# Patient Record
Sex: Female | Born: 1995 | Race: Black or African American | Hispanic: No | Marital: Single | Smoking: Never smoker
Health system: Southern US, Community
[De-identification: ages and names within clinical notes are randomized; demographics above are authoritative.]

## PROBLEM LIST (undated history)

## (undated) DIAGNOSIS — I10 Essential (primary) hypertension: Secondary | ICD-10-CM

---
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
## 2014-04-06 ENCOUNTER — Emergency Department (HOSPITAL_BASED_OUTPATIENT_CLINIC_OR_DEPARTMENT_OTHER)
Admission: EM | Admit: 2014-04-06 | Discharge: 2014-04-06 | Disposition: A | Discharge status: Home / Self Care | Payer: BC Managed Care – PPO | Attending: Emergency Medicine | Admitting: Emergency Medicine

## 2014-04-06 ENCOUNTER — Encounter (HOSPITAL_BASED_OUTPATIENT_CLINIC_OR_DEPARTMENT_OTHER): Payer: Self-pay | Admitting: Emergency Medicine

## 2014-04-06 DIAGNOSIS — Z79899 Other long term (current) drug therapy: Secondary | ICD-10-CM | POA: Insufficient documentation

## 2014-04-06 DIAGNOSIS — I10 Essential (primary) hypertension: Secondary | ICD-10-CM | POA: Insufficient documentation

## 2014-04-06 DIAGNOSIS — Z3202 Encounter for pregnancy test, result negative: Secondary | ICD-10-CM | POA: Insufficient documentation

## 2014-04-06 DIAGNOSIS — J029 Acute pharyngitis, unspecified: Secondary | ICD-10-CM | POA: Insufficient documentation

## 2014-04-06 DIAGNOSIS — Z792 Long term (current) use of antibiotics: Secondary | ICD-10-CM | POA: Diagnosis not present

## 2014-04-06 DIAGNOSIS — R300 Dysuria: Secondary | ICD-10-CM | POA: Diagnosis present

## 2014-04-06 DIAGNOSIS — R5381 Other malaise: Secondary | ICD-10-CM | POA: Diagnosis not present

## 2014-04-06 DIAGNOSIS — N3001 Acute cystitis with hematuria: Secondary | ICD-10-CM | POA: Insufficient documentation

## 2014-04-06 LAB — URINALYSIS, ROUTINE W REFLEX MICROSCOPIC
Glucose, UA: NEGATIVE mg/dL
KETONES UR: 15 mg/dL — AB
NITRITE: NEGATIVE
Protein, ur: 100 mg/dL — AB
SPECIFIC GRAVITY, URINE: 1.027 (ref 1.005–1.030)
UROBILINOGEN UA: 1 mg/dL (ref 0.0–1.0)
pH: 6 (ref 5.0–8.0)

## 2014-04-06 LAB — PREGNANCY, URINE: PREG TEST UR: NEGATIVE

## 2014-04-06 LAB — URINE MICROSCOPIC-ADD ON

## 2014-04-06 MED ORDER — PHENAZOPYRIDINE HCL 100 MG PO TABS
95.0000 mg | ORAL_TABLET | Freq: Once | ORAL | Status: AC
  Administered 2014-04-06: 100 mg via ORAL
  Filled 2014-04-06: qty 1

## 2014-04-06 MED ORDER — NITROFURANTOIN MONOHYD MACRO 100 MG PO CAPS
100.0000 mg | ORAL_CAPSULE | Freq: Once | ORAL | Status: AC
  Administered 2014-04-06: 100 mg via ORAL
  Filled 2014-04-06: qty 1

## 2014-04-06 MED ORDER — PHENAZOPYRIDINE HCL 200 MG PO TABS: 200.0000 mg | ORAL_TABLET | Freq: Three times a day (TID) | ORAL | Status: DC

## 2014-04-06 MED ORDER — NITROFURANTOIN MONOHYD MACRO 100 MG PO CAPS: 100.0000 mg | ORAL_CAPSULE | Freq: Two times a day (BID) | ORAL | Status: DC

## 2014-04-06 NOTE — ED Notes (Signed)
Pt reports hx bladder infection.  States that she thinks that she has one now.

## 2014-04-06 NOTE — ED Provider Notes (Signed)
CSN: [id number]     Arrival date & time 04/06/14  2140 History  This patient was seen in room MH01/MH01 and the patient's care was started at 10:47 PM.    Chief Complaint  Patient presents with  . Dysuria   The history is provided by the patient. No language interpreter was used.   HPI Comments: Gloria Reilly is a 18 y.o. female who presents to the Emergency Department complaining of a 2 day history of flulike symptoms which includes body aches, sore throat and general malaise. Today she developed dysuria. She is unable to describe what this dysuria feels like but she states that when she urinates she feels this discomfort throughout her entire body. She also has some urinary urgency. She has noted some hematuria. She cannot quantify how severe her symptoms are. She denies fever. She has had chills. She denies vaginal bleeding or discharge. She denies nausea or vomiting.  Past Medical History  Diagnosis Date  . Normal labor 02/14/2013  . Postpartum care following vaginal delivery (8/17) 02/14/2013  . Hypertension 02/28/2013   History reviewed. No pertinent past surgical history. Family History  Problem Relation Age of Onset  . Stroke Maternal Grandmother   . Diabetes Maternal Grandmother   . Diabetes Maternal Grandfather    History  Substance Use Topics  . Smoking status: Never Smoker   . Smokeless tobacco: Never Used  . Alcohol Use: No   OB History   Grav Para Term Preterm Abortions TAB SAB Ect Mult Living   1 1 1       1      Review of Systems A complete 10 system review of systems was obtained and all systems are negative except as noted in the HPI and PMH.    Allergies  Review of patient's allergies indicates no known allergies.  Home Medications   Prior to Admission medications   Medication Sig Start Date End Date Taking? Authorizing Provider  cephALEXin (KEFLEX) 500 MG capsule Take 1 capsule (500 mg total) by mouth 4 (four) times daily. 12/02/13   Shon Batonourtney F Horton, MD   hydrALAZINE (APRESOLINE) 10 MG tablet Take 1 tablet (10 mg total) by mouth 4 (four) times daily. 03/07/13   Sheronette Cathie BeamsA Cousins, MD  ibuprofen (ADVIL,MOTRIN) 100 MG/5ML suspension Take 30 mLs (600 mg total) by mouth every 6 (six) hours as needed for fever. 02/28/13   Lawernce PittsMelanie N Bhambri, CNM  ibuprofen (ADVIL,MOTRIN) 600 MG tablet Take 1 tablet (600 mg total) by mouth every 6 (six) hours as needed. 12/02/13   Shon Batonourtney F Horton, MD  labetalol (NORMODYNE) 300 MG tablet Take 1 tablet (300 mg total) by mouth 2 (two) times daily. 03/07/13   Sheronette Cathie BeamsA Cousins, MD  magnesium oxide (MAG-OX) 400 (241.3 MG) MG tablet Take 1 tablet (400 mg total) by mouth daily. 03/07/13   Sheronette Cathie BeamsA Cousins, MD  Prenatal Vit-Fe Fumarate-FA (PRENATAL MULTIVITAMIN) TABS tablet Take 1 tablet by mouth daily at 12 noon.    Historical Provider, MD   Triage vitals:BP 135/87  Pulse 109  Temp(Src) 99 F (37.2 C) (Oral)  Resp 18  Ht 5\' 6"  (1.676 m)  Wt 160 lb (72.576 kg)  BMI 25.84 kg/m2  SpO2 98%  LMP 03/23/2014  Breastfeeding? No  Physical Exam  Nursing note and vitals reviewed. General: Well-developed, well-nourished female in no acute distress; appearance consistent with age of record HENT: normocephalic; atraumatic; normal pharynx Eyes: pupils equal, round and reactive to light; extraocular muscles intact Neck: supple Heart: regular rate  and rhythm Lungs: clear to auscultation bilaterally Abdomen: soft; nondistended; nontender; no masses or hepatosplenomegaly; bowel sounds present Extremities: No deformity; full range of motion; pulses normal Neurologic: Awake, alert and oriented; motor function intact in all extremities and symmetric; no facial droop Skin: Warm and dry Psychiatric: Normal mood and affect   ED Course  Procedures (including critical care time)  MDM   Nursing notes and vitals signs, including pulse oximetry, reviewed.  Summary of this visit's results, reviewed by myself:  Labs:  Results for  orders placed during the hospital encounter of 04/06/14 (from the past 24 hour(s))  PREGNANCY, URINE     Status: None   Collection Time    04/06/14  9:53 PM      Result Value Ref Range   Preg Test, Ur NEGATIVE  NEGATIVE  URINALYSIS, ROUTINE W REFLEX MICROSCOPIC     Status: Abnormal   Collection Time    04/06/14  9:53 PM      Result Value Ref Range   Color, Urine AMBER (*) YELLOW   APPearance TURBID (*) CLEAR   Specific Gravity, Urine 1.027  1.005 - 1.030   pH 6.0  5.0 - 8.0   Glucose, UA NEGATIVE  NEGATIVE mg/dL   Hgb urine dipstick LARGE (*) NEGATIVE   Bilirubin Urine SMALL (*) NEGATIVE   Ketones, ur 15 (*) NEGATIVE mg/dL   Protein, ur 161 (*) NEGATIVE mg/dL   Urobilinogen, UA 1.0  0.0 - 1.0 mg/dL   Nitrite NEGATIVE  NEGATIVE   Leukocytes, UA LARGE (*) NEGATIVE  URINE MICROSCOPIC-ADD ON     Status: Abnormal   Collection Time    04/06/14  9:53 PM      Result Value Ref Range   Squamous Epithelial / LPF RARE  RARE   WBC, UA TOO NUMEROUS TO COUNT  <3 WBC/hpf   RBC / HPF 21-50  <3 RBC/hpf   Bacteria, UA FEW (*) RARE   Urine-Other MUCOUS PRESENT       Hanley Seamen, MD 04/06/14 [id number]

## 2014-04-06 NOTE — ED Notes (Signed)
C/o tingling w urination,  Blood in urine x 1 day

## 2014-04-06 NOTE — Discharge Instructions (Signed)

## 2014-04-06 NOTE — ED Notes (Signed)
MD at bedside. 

## 2014-04-09 LAB — URINE CULTURE

## 2014-04-11 ENCOUNTER — Telehealth (HOSPITAL_BASED_OUTPATIENT_CLINIC_OR_DEPARTMENT_OTHER): Payer: Self-pay

## 2014-04-11 NOTE — Telephone Encounter (Signed)
Post ED Visit - Positive Culture Follow-up  Culture report reviewed by antimicrobial stewardship pharmacist: []  Wes Dulaney, Pharm.D., BCPS []  Celedonio MiyamotoJeremy Frens, Pharm.D., BCPS []  Georgina PillionElizabeth Martin, Pharm.D., BCPS []  [place] Ph[place], [place] BCPS, AAHIVP [x]  Estella HuskMichelle Turner, Pharm.D., BCPS, AAHIVP []  Carly Sabat, Pharm.D. []  Enzo BiNathan Batchelder, Pharm.D.  Positive Urine culture Treated with Nitrofurantoin, organism sensitive to the same and no further patient follow-up is required at this time.  Arvid RightClark, Cace Osorto Dorn 04/11/2014, 5:17 AM

## 2014-05-02 ENCOUNTER — Encounter (HOSPITAL_BASED_OUTPATIENT_CLINIC_OR_DEPARTMENT_OTHER): Payer: Self-pay | Admitting: Emergency Medicine

## 2015-01-23 ENCOUNTER — Emergency Department (HOSPITAL_BASED_OUTPATIENT_CLINIC_OR_DEPARTMENT_OTHER): Payer: Medicaid Other

## 2015-01-23 ENCOUNTER — Emergency Department (HOSPITAL_BASED_OUTPATIENT_CLINIC_OR_DEPARTMENT_OTHER)
Admission: EM | Admit: 2015-01-23 | Discharge: 2015-01-23 | Disposition: A | Discharge status: Home / Self Care | Payer: Medicaid Other | Attending: Emergency Medicine | Admitting: Emergency Medicine

## 2015-01-23 ENCOUNTER — Encounter (HOSPITAL_BASED_OUTPATIENT_CLINIC_OR_DEPARTMENT_OTHER): Payer: Self-pay

## 2015-01-23 DIAGNOSIS — S161XXA Strain of muscle, fascia and tendon at neck level, initial encounter: Secondary | ICD-10-CM | POA: Diagnosis not present

## 2015-01-23 DIAGNOSIS — S39012A Strain of muscle, fascia and tendon of lower back, initial encounter: Secondary | ICD-10-CM | POA: Insufficient documentation

## 2015-01-23 DIAGNOSIS — I10 Essential (primary) hypertension: Secondary | ICD-10-CM | POA: Insufficient documentation

## 2015-01-23 DIAGNOSIS — Y9389 Activity, other specified: Secondary | ICD-10-CM | POA: Diagnosis not present

## 2015-01-23 DIAGNOSIS — Y92410 Unspecified street and highway as the place of occurrence of the external cause: Secondary | ICD-10-CM | POA: Diagnosis not present

## 2015-01-23 DIAGNOSIS — S199XXA Unspecified injury of neck, initial encounter: Secondary | ICD-10-CM | POA: Diagnosis present

## 2015-01-23 DIAGNOSIS — Y998 Other external cause status: Secondary | ICD-10-CM | POA: Diagnosis not present

## 2015-01-23 DIAGNOSIS — S4991XA Unspecified injury of right shoulder and upper arm, initial encounter: Secondary | ICD-10-CM | POA: Insufficient documentation

## 2015-01-23 DIAGNOSIS — S4992XA Unspecified injury of left shoulder and upper arm, initial encounter: Secondary | ICD-10-CM | POA: Diagnosis not present

## 2015-01-23 DIAGNOSIS — S299XXA Unspecified injury of thorax, initial encounter: Secondary | ICD-10-CM | POA: Diagnosis not present

## 2015-01-23 MED ORDER — NAPROXEN 500 MG PO TABS: 500.0000 mg | ORAL_TABLET | Freq: Two times a day (BID) | ORAL | Status: DC

## 2015-01-23 MED ORDER — METHOCARBAMOL 500 MG PO TABS: 500.0000 mg | ORAL_TABLET | Freq: Three times a day (TID) | ORAL | Status: DC | PRN
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
## 2015-01-23 NOTE — ED Notes (Signed)
MD at bedside. 

## 2015-01-23 NOTE — ED Notes (Signed)
All previous orders entered was entered in error

## 2015-09-14 ENCOUNTER — Encounter (HOSPITAL_BASED_OUTPATIENT_CLINIC_OR_DEPARTMENT_OTHER): Payer: Self-pay | Admitting: Emergency Medicine

## 2015-09-14 DIAGNOSIS — Z791 Long term (current) use of non-steroidal anti-inflammatories (NSAID): Secondary | ICD-10-CM | POA: Insufficient documentation

## 2015-09-14 DIAGNOSIS — Z3202 Encounter for pregnancy test, result negative: Secondary | ICD-10-CM | POA: Insufficient documentation

## 2015-09-14 DIAGNOSIS — I10 Essential (primary) hypertension: Secondary | ICD-10-CM | POA: Insufficient documentation

## 2015-09-14 NOTE — ED Notes (Signed)
Pt reports checking BP in [facility] tonight states it was 147/102 MAP 117. Pt reports chest discomfort when elevated. Not prescribed any medications.

## 2015-09-15 ENCOUNTER — Emergency Department (HOSPITAL_BASED_OUTPATIENT_CLINIC_OR_DEPARTMENT_OTHER)
Admission: EM | Admit: 2015-09-15 | Discharge: 2015-09-15 | Disposition: A | Discharge status: Home / Self Care | Payer: No Typology Code available for payment source | Attending: Emergency Medicine | Admitting: Emergency Medicine

## 2015-09-15 ENCOUNTER — Encounter (HOSPITAL_BASED_OUTPATIENT_CLINIC_OR_DEPARTMENT_OTHER): Payer: Self-pay | Admitting: Emergency Medicine

## 2015-09-15 DIAGNOSIS — I10 Essential (primary) hypertension: Secondary | ICD-10-CM

## 2015-09-15 LAB — URINE MICROSCOPIC-ADD ON

## 2015-09-15 LAB — URINALYSIS, ROUTINE W REFLEX MICROSCOPIC
BILIRUBIN URINE: NEGATIVE
GLUCOSE, UA: NEGATIVE mg/dL
HGB URINE DIPSTICK: NEGATIVE
Ketones, ur: NEGATIVE mg/dL
Nitrite: NEGATIVE
PH: 7 (ref 5.0–8.0)
Protein, ur: NEGATIVE mg/dL
SPECIFIC GRAVITY, URINE: 1.025 (ref 1.005–1.030)

## 2015-09-15 LAB — BASIC METABOLIC PANEL
ANION GAP: 9 (ref 5–15)
BUN: 10 mg/dL (ref 6–20)
CHLORIDE: 101 mmol/L (ref 101–111)
CO2: 26 mmol/L (ref 22–32)
Calcium: 9.1 mg/dL (ref 8.9–10.3)
Creatinine, Ser: 0.88 mg/dL (ref 0.44–1.00)
GFR calc Af Amer: >60 mL/min (ref >60)
GFR calc non Af Amer: >60 mL/min (ref >60)
GLUCOSE: 97 mg/dL (ref 65–99)
POTASSIUM: 3.7 mmol/L (ref 3.5–5.1)
Sodium: 136 mmol/L (ref 135–145)

## 2015-09-15 LAB — PREGNANCY, URINE: PREG TEST UR: NEGATIVE

## 2015-09-15 NOTE — ED Notes (Signed)
MD at bedside. 

## 2015-09-15 NOTE — Discharge Instructions (Signed)
DASH Eating Plan  DASH stands for "Dietary Approaches to Stop Hypertension." The DASH eating plan is a healthy eating plan that has been shown to reduce high blood pressure (hypertension). Additional health benefits may include reducing the risk of type 2 diabetes mellitus, heart disease, and stroke. The DASH eating plan may also help with weight loss.  WHAT DO I NEED TO KNOW ABOUT THE DASH EATING PLAN?  For the DASH eating plan, you will follow these general guidelines:  · Choose foods with a percent daily value for sodium of less than 5% (as listed on the food label).  · Use salt-free seasonings or herbs instead of table salt or sea salt.  · Check with your health care provider or pharmacist before using salt substitutes.  · Eat lower-sodium products, often labeled as "lower sodium" or "no salt added."  · Eat fresh foods.  · Eat more vegetables, fruits, and low-fat dairy products.  · Choose whole grains. Look for the word "whole" as the first word in the ingredient list.  · Choose fish and skinless chicken or turkey more often than red meat. Limit fish, poultry, and meat to 6 oz (170 g) each day.  · Limit sweets, desserts, sugars, and sugary drinks.  · Choose heart-healthy fats.  · Limit cheese to 1 oz (28 g) per day.  · Eat more home-cooked food and less restaurant, buffet, and fast food.  · Limit fried foods.  · Cook foods using methods other than frying.  · Limit canned vegetables. If you do use them, rinse them well to decrease the sodium.  · When eating at a restaurant, ask that your food be prepared with less salt, or no salt if possible.  WHAT FOODS CAN I EAT?  Seek help from a dietitian for individual calorie needs.  Grains  Whole grain or whole wheat bread. Brown rice. Whole grain or whole wheat pasta. Quinoa, bulgur, and whole grain cereals. Low-sodium cereals. Corn or whole wheat flour tortillas. Whole grain cornbread. Whole grain crackers. Low-sodium crackers.  Vegetables  Fresh or frozen vegetables  (raw, steamed, roasted, or grilled). Low-sodium or reduced-sodium tomato and vegetable juices. Low-sodium or reduced-sodium tomato sauce and paste. Low-sodium or reduced-sodium canned vegetables.   Fruits  All fresh, canned (in natural juice), or frozen fruits.  Meat and Other Protein Products  Ground beef (85% or leaner), grass-fed beef, or beef trimmed of fat. Skinless chicken or turkey. Ground chicken or turkey. Pork trimmed of fat. All fish and seafood. Eggs. Dried beans, peas, or lentils. Unsalted nuts and seeds. Unsalted canned beans.  Dairy  Low-fat dairy products, such as skim or 1% milk, 2% or reduced-fat cheeses, low-fat ricotta or cottage cheese, or plain low-fat yogurt. Low-sodium or reduced-sodium cheeses.  Fats and Oils  Tub margarines without trans fats. Light or reduced-fat mayonnaise and salad dressings (reduced sodium). Avocado. Safflower, olive, or canola oils. Natural peanut or almond butter.  Other  Unsalted popcorn and pretzels.  The items listed above may not be a complete list of recommended foods or beverages. Contact your dietitian for more options.  WHAT FOODS ARE NOT RECOMMENDED?  Grains  White bread. White pasta. White rice. Refined cornbread. Bagels and croissants. Crackers that contain trans fat.  Vegetables  Creamed or fried vegetables. Vegetables in a cheese sauce. Regular canned vegetables. Regular canned tomato sauce and paste. Regular tomato and vegetable juices.  Fruits  Dried fruits. Canned fruit in light or heavy syrup. Fruit juice.  Meat and Other Protein   Products  Fatty cuts of meat. Ribs, chicken wings, bacon, sausage, bologna, salami, chitterlings, fatback, hot dogs, bratwurst, and packaged luncheon meats. Salted nuts and seeds. Canned beans with salt.  Dairy  Whole or 2% milk, cream, half-and-half, and cream cheese. Whole-fat or sweetened yogurt. Full-fat cheeses or blue cheese. Nondairy creamers and whipped toppings. Processed cheese, cheese spreads, or cheese  curds.  Condiments  Onion and garlic salt, seasoned salt, table salt, and sea salt. Canned and packaged gravies. Worcestershire sauce. Tartar sauce. Barbecue sauce. Teriyaki sauce. Soy sauce, including reduced sodium. Steak sauce. Fish sauce. Oyster sauce. Cocktail sauce. Horseradish. Ketchup and mustard. Meat flavorings and tenderizers. Bouillon cubes. Hot sauce. Tabasco sauce. Marinades. Taco seasonings. Relishes.  Fats and Oils  Butter, stick margarine, lard, shortening, ghee, and bacon fat. Coconut, palm kernel, or palm oils. Regular salad dressings.  Other  Pickles and olives. Salted popcorn and pretzels.  The items listed above may not be a complete list of foods and beverages to avoid. Contact your dietitian for more information.  WHERE CAN I FIND MORE INFORMATION?  National Heart, Lung, and [employer] Institute: www.nhlbi.nih.gov/health/health-topics/topics/dash/     This information is not intended to replace advice given to you by your health care provider. Make sure you discuss any questions you have with your health care provider.     Document Released: 06/06/2011 Document Revised: 07/08/2014 Document Reviewed: 04/21/2013  Elsevier Interactive Patient Education ©2016 [employer]

## 2015-09-15 NOTE — ED Notes (Signed)
Pt placed on auto vitals Q30. Patient placed on cardiac monitor.  

## 2015-09-15 NOTE — ED Provider Notes (Signed)
CSN: [id number]     Arrival date & time 09/14/15  2155 History   First MD Initiated Contact with Patient 09/15/15 0121     Chief Complaint  Patient presents with  . Hypertension     (Consider location/radiation/quality/duration/timing/severity/associated sxs/prior Treatment) Patient is a 20 y.o. female presenting with hypertension. The history is provided by the patient.  Hypertension This is a recurrent problem. The current episode started yesterday. The problem occurs constantly. The problem has not changed since onset.Pertinent negatives include no chest pain, no abdominal pain, no headaches and no shortness of breath. Nothing aggravates the symptoms. Nothing relieves the symptoms. She has tried nothing for the symptoms. The treatment provided no relief.  HAd HTN during and after pregnancy and meds tapered off now it is back.    Past Medical History  Diagnosis Date  . Normal labor 02/14/2013  . Postpartum care following vaginal delivery (8/17) 02/14/2013  . Hypertension 02/28/2013   History reviewed. No pertinent past surgical history. Family History  Problem Relation Age of Onset  . Stroke Maternal Grandmother   . Diabetes Maternal Grandmother   . Diabetes Maternal Grandfather    Social History  Substance Use Topics  . Smoking status: Never Smoker   . Smokeless tobacco: Never Used  . Alcohol Use: Yes   OB History    Gravida Para Term Preterm AB TAB SAB Ectopic Multiple Living   Review of Systems  Respiratory: Negative for shortness of breath.   Cardiovascular: Negative for chest pain.  Gastrointestinal: Negative for abdominal pain.  Neurological: Negative for headaches.  All other systems reviewed and are negative.     Allergies  Review of patient's allergies indicates no known allergies.  Home Medications   Prior to Admission medications   Medication Sig Start Date End Date Taking? Authorizing Provider  methocarbamol (ROBAXIN) 500 MG tablet  Take 1 tablet (500 mg total) by mouth every 8 (eight) hours as needed for muscle spasms. 01/23/15   Robyn M Hess, PA-C  naproxen (NAPROSYN) 500 MG tablet Take 1 tablet (500 mg total) by mouth 2 (two) times daily. 01/23/15   Robyn M Hess, PA-C   BP 150/105 mmHg  Pulse 74  Temp(Src) 98.4 F (36.9 C) (Oral)  Resp 18  Ht  (1.702 m)  Wt 162 lb 7 oz (73.681 kg)  BMI 25.44 kg/m2  SpO2 100%  LMP 09/12/2015 Physical Exam  Constitutional: She is oriented to person, place, and time. She appears well-developed and well-nourished. No distress.  HENT:  Head: Normocephalic and atraumatic.  Mouth/Throat: Oropharynx is clear and moist.  Eyes: Conjunctivae are normal. Pupils are equal, round, and reactive to light.  Neck: Normal range of motion. Neck supple.  Cardiovascular: Normal rate, regular rhythm and intact distal pulses.   Pulmonary/Chest: Effort normal and breath sounds normal. No respiratory distress. She has no wheezes. She has no rales.  Abdominal: Soft. Bowel sounds are normal. There is no tenderness. There is no rebound and no guarding.  Musculoskeletal: Normal range of motion.  Neurological: She is alert and oriented to person, place, and time.  Skin: Skin is warm and dry.  Psychiatric: She has a normal mood and affect.    ED Course  Procedures (including critical care time) Labs Review Labs Reviewed  URINALYSIS, ROUTINE W REFLEX MICROSCOPIC (NOT AT [facility]) - Abnormal; Notable for the following:    APPearance CLOUDY (*)    Leukocytes, UA  SMALL (*)    All other components within normal limits  URINE MICROSCOPIC-ADD ON - Abnormal; Notable for the following:    Squamous Epithelial / LPF 0-5 (*)    Bacteria, UA FEW (*)    All other components within normal limits  PREGNANCY, URINE  BASIC METABOLIC PANEL    Imaging Review No results found. I have personally reviewed and evaluated these images and lab results as part of my medical decision-making.   EKG  Interpretation   Date/Time:  Friday September 15 2015 01:06:34 EDT Ventricular Rate:  81 PR Interval:  166 QRS Duration: 88 QT Interval:  364 QTC Calculation: 422 R Axis:   81 Text Interpretation:  Sinus rhythm Confirmed by [facility]  MD, Sherrod Toothman  ([id number]) on 09/15/2015 1:13:14 AM      MDM   Final diagnoses:  None   BP improved without intervention in the ED   Low sodium diet and follow up with PMD for ongoing care.  Patient verbalizes understanding and agrees to follow up    Amoria Mclees, MD 09/15/15 [id number]

## 2015-10-07 DIAGNOSIS — Z79899 Other long term (current) drug therapy: Secondary | ICD-10-CM | POA: Insufficient documentation

## 2015-10-07 DIAGNOSIS — I10 Essential (primary) hypertension: Secondary | ICD-10-CM | POA: Insufficient documentation

## 2015-10-07 DIAGNOSIS — J111 Influenza due to unidentified influenza virus with other respiratory manifestations: Secondary | ICD-10-CM | POA: Insufficient documentation

## 2015-10-08 ENCOUNTER — Encounter (HOSPITAL_BASED_OUTPATIENT_CLINIC_OR_DEPARTMENT_OTHER): Payer: Self-pay

## 2015-10-08 ENCOUNTER — Emergency Department (HOSPITAL_BASED_OUTPATIENT_CLINIC_OR_DEPARTMENT_OTHER)
Admission: EM | Admit: 2015-10-08 | Discharge: 2015-10-08 | Disposition: A | Discharge status: Home / Self Care | Payer: No Typology Code available for payment source | Attending: Emergency Medicine | Admitting: Emergency Medicine

## 2015-10-08 DIAGNOSIS — J111 Influenza due to unidentified influenza virus with other respiratory manifestations: Secondary | ICD-10-CM

## 2015-10-08 DIAGNOSIS — R69 Illness, unspecified: Secondary | ICD-10-CM

## 2015-10-08 LAB — RAPID STREP SCREEN (MED CTR MEBANE ONLY): Streptococcus, Group A Screen (Direct): NEGATIVE

## 2015-10-08 MED ORDER — NAPROXEN 500 MG PO TABS: ORAL_TABLET | ORAL | Status: DC

## 2015-10-08 MED ORDER — IBUPROFEN 400 MG PO TABS
600.0000 mg | ORAL_TABLET | Freq: Once | ORAL | Status: AC
  Administered 2015-10-08: 600 mg via ORAL
  Filled 2015-10-08: qty 1

## 2015-10-08 MED ORDER — NAPROXEN 250 MG PO TABS
500.0000 mg | ORAL_TABLET | Freq: Once | ORAL | Status: DC
Start: 2015-10-08 — End: 2015-10-08

## 2015-10-08 MED ORDER — OXYCODONE-ACETAMINOPHEN 5-325 MG PO TABS
1.0000 | ORAL_TABLET | Freq: Once | ORAL | Status: AC
  Administered 2015-10-08: 1 via ORAL
  Filled 2015-10-08: qty 1

## 2015-10-08 MED ORDER — ONDANSETRON 8 MG PO TBDP
8.0000 mg | ORAL_TABLET | Freq: Once | ORAL | Status: AC
  Administered 2015-10-08: 8 mg via ORAL
  Filled 2015-10-08: qty 1

## 2015-10-08 NOTE — ED Notes (Addendum)
Pt reports 3 day history of sore throat, stuffy nose, body aches, ear pain, "think i have the flu."

## 2015-10-08 NOTE — ED Provider Notes (Signed)
CSN: [id number]     Arrival date & time 10/07/15  2339 History   First MD Initiated Contact with Patient 10/08/15 0402     Chief Complaint  Patient presents with  . Sore Throat     (Consider location/radiation/quality/duration/timing/severity/associated sxs/prior Treatment) HPI  This is a 20 year old female with a two-day history of sore throat, worse with swallowing. She has also had body aches, ear pain and nonproductive cough. She denies shortness of breath, nasal congestion, nausea, vomiting or diarrhea. Symptoms are moderate. She has not taken anything for it.  Past Medical History  Diagnosis Date  . Normal labor 02/14/2013  . Postpartum care following vaginal delivery (8/17) 02/14/2013  . Hypertension 02/28/2013   History reviewed. No pertinent past surgical history. Family History  Problem Relation Age of Onset  . Stroke Maternal Grandmother   . Diabetes Maternal Grandmother   . Diabetes Maternal Grandfather    Social History  Substance Use Topics  . Smoking status: Never Smoker   . Smokeless tobacco: Never Used  . Alcohol Use: Yes     Comment: occasional    OB History    Gravida Para Term Preterm AB TAB SAB Ectopic Multiple Living   1 1 1       1      Review of Systems  All other systems reviewed and are negative.   Allergies  Review of patient's allergies indicates no known allergies.  Home Medications   Prior to Admission medications   Medication Sig Start Date End Date Taking? Authorizing Provider  Etonogestrel (IMPLANON [place]) Inject into the skin.   Yes Historical Provider, MD  methocarbamol (ROBAXIN) 500 MG tablet Take 1 tablet (500 mg total) by mouth every 8 (eight) hours as needed for muscle spasms. 01/23/15   Robyn M Hess, PA-C  naproxen (NAPROSYN) 500 MG tablet Take 1 tablet (500 mg total) by mouth 2 (two) times daily. 01/23/15   Robyn M Hess, PA-C   BP 136/96 mmHg  Pulse 109  Temp(Src) 99.1 F (37.3 C) (Oral)  Resp 16  Ht 5\' 6"  (1.676 m)  Wt 162 lb  (73.483 kg)  BMI 26.16 kg/m2  SpO2 99%  LMP 09/02/2015 (LMP Unknown)   Physical Exam  General: Well-developed, well-nourished female in no acute distress; appearance consistent with age of record HENT: normocephalic; atraumatic; TMs normal; pharynx erythematous without edema or exudate Eyes: pupils equal, round and reactive to light; extraocular muscles intact Neck: supple Heart: regular rate and rhythm Lungs: clear to auscultation bilaterally Abdomen: soft; nondistended; nontender; no masses or hepatosplenomegaly; bowel sounds present Extremities: No deformity; full range of motion; pulses normal Neurologic: Sleeping but readily awakened; motor function intact in all extremities and symmetric; no facial droop Skin: Warm and dry Psychiatric: Normal mood and affect    ED Course  Procedures (including critical care time)   MDM     Paula LibraJohn Chapman Matteucci, MD 10/08/15 [id number]

## 2015-10-09 LAB — CULTURE, GROUP A STREP (THRC)

## 2015-10-16 ENCOUNTER — Emergency Department (HOSPITAL_BASED_OUTPATIENT_CLINIC_OR_DEPARTMENT_OTHER)
Admission: EM | Admit: 2015-10-16 | Discharge: 2015-10-16 | Disposition: A | Discharge status: Home / Self Care | Payer: No Typology Code available for payment source | Attending: Emergency Medicine | Admitting: Emergency Medicine

## 2015-10-16 ENCOUNTER — Encounter (HOSPITAL_BASED_OUTPATIENT_CLINIC_OR_DEPARTMENT_OTHER): Payer: Self-pay | Admitting: Emergency Medicine

## 2015-10-16 DIAGNOSIS — R053 Chronic cough: Secondary | ICD-10-CM

## 2015-10-16 DIAGNOSIS — R05 Cough: Secondary | ICD-10-CM | POA: Insufficient documentation

## 2015-10-16 DIAGNOSIS — Z113 Encounter for screening for infections with a predominantly sexual mode of transmission: Secondary | ICD-10-CM | POA: Insufficient documentation

## 2015-10-16 DIAGNOSIS — I10 Essential (primary) hypertension: Secondary | ICD-10-CM | POA: Insufficient documentation

## 2015-10-16 MED ORDER — AZITHROMYCIN 250 MG PO TABS: ORAL_TABLET | ORAL | Status: DC

## 2015-10-16 MED ORDER — ALBUTEROL SULFATE HFA 108 (90 BASE) MCG/ACT IN AERS
2.0000 | INHALATION_SPRAY | RESPIRATORY_TRACT | Status: DC | PRN
  Administered 2015-10-16: 2 via RESPIRATORY_TRACT
  Filled 2015-10-16: qty 6.7

## 2015-10-16 NOTE — ED Notes (Signed)
Pt in c/o cough and sore throat onset approx 2 weeks. Has been seen and treated for same but feels no better. Also states has been sexually active and is requesting to be screened for STDs.

## 2015-10-16 NOTE — ED Provider Notes (Signed)
CSN: [id number]     Arrival date & time 10/16/15  0055 History   First MD Initiated Contact with Patient 10/16/15 0110     Chief Complaint  Patient presents with  . Cough  . Sore Throat     (Consider location/radiation/quality/duration/timing/severity/associated sxs/prior Treatment) HPI  This is a 20 year old female who was seen by myself on the ninth of this month for flulike symptoms. She returns complaining of persistent cough and sore throat. She describes the cough as dry and feels like she is unable to cough up mucus. Her sore throat is moderate and worse with swallowing. She denies fever, body aches, nausea, vomiting or diarrhea. She has no vaginal bleeding or discharge but has been sexually active and is requesting to be tested for STDs.  Past Medical History  Diagnosis Date  . Normal labor 02/14/2013  . Postpartum care following vaginal delivery (8/17) 02/14/2013  . Hypertension 02/28/2013   History reviewed. No pertinent past surgical history. Family History  Problem Relation Age of Onset  . Stroke Maternal Grandmother   . Diabetes Maternal Grandmother   . Diabetes Maternal Grandfather    Social History  Substance Use Topics  . Smoking status: Never Smoker   . Smokeless tobacco: Never Used  . Alcohol Use: Yes     Comment: occasional    OB History    Gravida Para Term Preterm AB TAB SAB Ectopic Multiple Living   1 1 1       1      Review of Systems  All other systems reviewed and are negative.   Allergies  Review of patient's allergies indicates no known allergies.  Home Medications   Prior to Admission medications   Medication Sig Start Date End Date Taking? Authorizing Provider  Etonogestrel (IMPLANON [place]) Inject into the skin.    Historical Provider, MD  naproxen (NAPROSYN) 500 MG tablet Take 1 tablet twice daily as needed for fever or pain. 10/08/15   Evee Liska, MD   BP 134/87 mmHg  Pulse 79  Temp(Src) 98.8 F (37.1 C) (Oral)  Resp 18  Ht 5\' 6"  (1.676  m)  Wt 152 lb (68.947 kg)  BMI 24.55 kg/m2  SpO2 99%  LMP 09/02/2015 (LMP Unknown)   Physical Exam  General: Well-developed, well-nourished female in no acute distress; appearance consistent with age of record HENT: normocephalic; atraumatic; no pharyngeal erythema or exudate Eyes: pupils equal, round and reactive to light; extraocular muscles intact Neck: supple Heart: regular rate and rhythm Lungs: clear to auscultation bilaterally; coughing Abdomen: soft; nondistended; nontender; no masses or hepatosplenomegaly; bowel sounds present Extremities: No deformity; full range of motion; pulses normal Neurologic: Awake, alert and oriented; motor function intact in all extremities and symmetric; no facial droop Skin: Warm and dry Psychiatric: Normal mood and affect    ED Course  Procedures (including critical care time)   MDM  The patient's strep culture did not grow out group A strep but did grow out a beta-hemolytic strep strain. We will treat as a precaution. We will also provide an inhaler for her persistent cough.   Paula LibraJohn Devaunte Gasparini, MD 10/16/15 [phone]

## 2015-10-17 LAB — GC/CHLAMYDIA PROBE AMP (~~LOC~~) NOT AT ARMC
CHLAMYDIA, DNA PROBE: POSITIVE — AB
NEISSERIA GONORRHEA: NEGATIVE

## 2015-10-18 ENCOUNTER — Telehealth: Payer: Self-pay | Admitting: *Deleted

## 2015-10-18 LAB — HIV ANTIBODY (ROUTINE TESTING W REFLEX): HIV SCREEN 4TH GENERATION: NONREACTIVE

## 2015-10-18 NOTE — ED Notes (Signed)
Spoke with patient, verified ID, informed of labs, treated per protocol, DHHS form faxed, patient informed to abstain for sexual activity x 10 days and notify sexual partners for evaluation and treatment 

## 2015-10-19 LAB — RPR: RPR Ser Ql: NONREACTIVE

## 2015-12-30 ENCOUNTER — Emergency Department (HOSPITAL_BASED_OUTPATIENT_CLINIC_OR_DEPARTMENT_OTHER)
Admission: EM | Admit: 2015-12-30 | Discharge: 2015-12-30 | Disposition: A | Discharge status: Home / Self Care | Payer: No Typology Code available for payment source | Attending: Emergency Medicine | Admitting: Emergency Medicine

## 2015-12-30 ENCOUNTER — Encounter (HOSPITAL_BASED_OUTPATIENT_CLINIC_OR_DEPARTMENT_OTHER): Payer: Self-pay | Admitting: *Deleted

## 2015-12-30 DIAGNOSIS — I10 Essential (primary) hypertension: Secondary | ICD-10-CM | POA: Insufficient documentation

## 2015-12-30 DIAGNOSIS — N938 Other specified abnormal uterine and vaginal bleeding: Secondary | ICD-10-CM | POA: Insufficient documentation

## 2015-12-30 LAB — URINALYSIS, ROUTINE W REFLEX MICROSCOPIC
BILIRUBIN URINE: NEGATIVE
Glucose, UA: NEGATIVE mg/dL
Ketones, ur: 15 mg/dL — AB
Leukocytes, UA: NEGATIVE
NITRITE: NEGATIVE
PH: 6 (ref 5.0–8.0)
Protein, ur: NEGATIVE mg/dL
SPECIFIC GRAVITY, URINE: 1.037 — AB (ref 1.005–1.030)

## 2015-12-30 LAB — URINE MICROSCOPIC-ADD ON

## 2015-12-30 LAB — PREGNANCY, URINE: Preg Test, Ur: NEGATIVE

## 2015-12-30 LAB — WET PREP, GENITAL
SPERM: NONE SEEN
Trich, Wet Prep: NONE SEEN
YEAST WET PREP: NONE SEEN

## 2015-12-30 MED ORDER — CEFTRIAXONE SODIUM 250 MG IJ SOLR
250.0000 mg | Freq: Once | INTRAMUSCULAR | Status: AC
  Administered 2015-12-30: 250 mg via INTRAMUSCULAR
  Filled 2015-12-30: qty 250

## 2015-12-30 MED ORDER — AZITHROMYCIN 250 MG PO TABS
1000.0000 mg | ORAL_TABLET | Freq: Once | ORAL | Status: AC
  Administered 2015-12-30: 1000 mg via ORAL
  Filled 2015-12-30: qty 4

## 2015-12-30 NOTE — ED Provider Notes (Signed)
CSN: [id number]     Arrival date & time 12/30/15  1319 History   First MD Initiated Contact with Patient 12/30/15 1344     Chief Complaint  Patient presents with  . Abdominal Pain     (Consider location/radiation/quality/duration/timing/severity/associated sxs/prior Treatment) Patient is a 20 y.o. female presenting with abdominal pain. The history is provided by the patient.  Abdominal Pain Pain location:  Suprapubic Pain quality: cramping   Pain radiates to:  Does not radiate Pain severity:  Moderate Onset quality:  Gradual Duration:  2 days Timing:  Intermittent Progression:  Waxing and waning Chronicity:  New Context comment:  Patient currently has implant birth control but this month has had 3 periods. The birth control is supposed to be changed in September. She started bleeding yesterday and states it's mixed with blood and discharge. Relieved by:  None tried Worsened by:  Nothing tried Ineffective treatments:  None tried Associated symptoms: vaginal bleeding and vaginal discharge   Associated symptoms: no anorexia, no chills, no diarrhea, no dysuria, no fever, no nausea and no vomiting   Risk factors comment:  Patient was treated for chlamydia approximately 2-3 months ago and is currently sexually active with a new partner and does not use protection.   Past Medical History  Diagnosis Date  . Normal labor 02/14/2013  . Postpartum care following vaginal delivery (8/17) 02/14/2013  . Hypertension 02/28/2013   History reviewed. No pertinent past surgical history. Family History  Problem Relation Age of Onset  . Stroke Maternal Grandmother   . Diabetes Maternal Grandmother   . Diabetes Maternal Grandfather    Social History  Substance Use Topics  . Smoking status: Never Smoker   . Smokeless tobacco: Never Used  . Alcohol Use: Yes     Comment: occasional    OB History    Gravida Para Term Preterm AB TAB SAB Ectopic Multiple Living   1 1 1       1      Review of  Systems  Constitutional: Negative for fever and chills.  Gastrointestinal: Positive for abdominal pain. Negative for nausea, vomiting, diarrhea and anorexia.  Genitourinary: Positive for vaginal bleeding and vaginal discharge. Negative for dysuria.  All other systems reviewed and are negative.     Allergies  Review of patient's allergies indicates no known allergies.  Home Medications   Prior to Admission medications   Medication Sig Start Date End Date Taking? Authorizing Provider  azithromycin (ZITHROMAX Z-PAK) 250 MG tablet 2 po day one, then 1 daily x 4 days 10/16/15   Paula LibraJohn Molpus, MD  Etonogestrel [facility](IMPLANON [place]) Inject into the skin.    Historical Provider, MD  naproxen (NAPROSYN) 500 MG tablet Take 1 tablet twice daily as needed for fever or pain. 10/08/15   John Molpus, MD   BP 137/94 mmHg  Pulse 82  Temp(Src) 99.2 F (37.3 C) (Oral)  Resp 18  Ht 5\' 7"  (1.702 m)  Wt 165 lb (74.844 kg)  BMI 25.84 kg/m2  SpO2 100% Physical Exam  Constitutional: She is oriented to person, place, and time. She appears well-developed and well-nourished. No distress.  HENT:  Head: Normocephalic and atraumatic.  Mouth/Throat: Oropharynx is clear and moist.  Eyes: Conjunctivae and EOM are normal. Pupils are equal, round, and reactive to light.  Neck: Normal range of motion. Neck supple.  Cardiovascular: Normal rate, regular rhythm and intact distal pulses.   No murmur heard. Pulmonary/Chest: Effort normal and breath sounds normal. No respiratory distress. She has no wheezes.  She has no rales.  Abdominal: Soft. She exhibits no distension. There is tenderness. There is no rebound and no guarding.  Genitourinary: Vagina normal and uterus normal. Cervix exhibits no motion tenderness, no discharge and no friability. Right adnexum displays no mass and no tenderness. Left adnexum displays no mass and no tenderness. No vaginal discharge found.  Musculoskeletal: Normal range of motion. She exhibits no  edema or tenderness.  Neurological: She is alert and oriented to person, place, and time.  Skin: Skin is warm and dry. No rash noted. No erythema.  Psychiatric: She has a normal mood and affect. Her behavior is normal.  Nursing note and vitals reviewed.   ED Course  Procedures (including critical care time) Labs Review Labs Reviewed  WET PREP, GENITAL - Abnormal; Notable for the following:    Clue Cells Wet Prep HPF POC PRESENT (*)    WBC, Wet Prep HPF POC MANY (*)    All other components within normal limits  URINALYSIS, ROUTINE W REFLEX MICROSCOPIC (NOT AT [facility]) - Abnormal; Notable for the following:    Specific Gravity, Urine 1.037 (*)    Hgb urine dipstick TRACE (*)    Ketones, ur 15 (*)    All other components within normal limits  URINE MICROSCOPIC-ADD ON - Abnormal; Notable for the following:    Squamous Epithelial / LPF 0-5 (*)    Bacteria, UA MANY (*)    All other components within normal limits  PREGNANCY, URINE  HIV ANTIBODY (ROUTINE TESTING)  RPR  GC/CHLAMYDIA PROBE AMP ([place]) NOT AT [facility]    Imaging Review No results found. I have personally reviewed and evaluated these images and lab results as part of my medical decision-making.   EKG Interpretation None      MDM   Final diagnoses:  DUB (dysfunctional uterine bleeding)    Patient is a 20 year old female with lower abdominal pain that is most consistent with menstrual cramping and dysfunctional uterine bleeding which is most likely a result of her birth control getting ready to be replaced. She has had some bleeding for 2 days and some minimal discharge. However she months ago she was treated for chlamydia but states she lost one of the pills and did not get the full antibiotic. She is with a new sexual partner and as far she knows he is having no symptoms. She denies any urinary complaints and her urine pregnancy test is negative. On exam she has no concerning abdominal pain that's reproducible. No  upper abdominal pain or concerns for cholecystitis, appendicitis, pancreatitis or diverticulitis. No flank pain and no urinary symptoms concerning for UTI or pyelonephritis. Urine is within normal limits. Pelvic exam pending. Patient is opting for treatment for STDs prophylactically. She will be given Rocephin and azithromycin. GC chlamydia, HIV and syphilis pending. Pelvic exam without notable findings. Secondly patient is getting intermittent right rashes on her hands. There is minimal papular lesions on bilateral dorsal hands today without concerning findings. Most likely allergic versus mild eczema firm excessive handwashing. Recommended using thick lotions and hydrocortisone cream when necessary    Gwyneth SproutWhitney Jamarien Rodkey, MD 12/30/15 1513

## 2015-12-30 NOTE — ED Notes (Signed)
MD at bedside. 

## 2015-12-30 NOTE — Discharge Instructions (Signed)
Try Ibuprofen or Aleve for abdominal cramps Dysfunctional Uterine Bleeding Dysfunctional uterine bleeding is abnormal bleeding from the uterus. Dysfunctional uterine bleeding includes:  A period that comes earlier or later than usual.  A period that is lighter, heavier, or has blood clots.  Bleeding between periods.  Skipping one or more periods.  Bleeding after sexual intercourse.  Bleeding after menopause. HOME CARE INSTRUCTIONS  Pay attention to any changes in your symptoms. Follow these instructions to help with your condition: Eating  Eat well-balanced meals. Include foods that are high in iron, such as liver, meat, shellfish, green leafy vegetables, and eggs.  If you become constipated:  Drink plenty of water.  Eat fruits and vegetables that are high in water and fiber, such as spinach, carrots, raspberries, apples, and mango. Medicines  Take over-the-counter and prescription medicines only as told by your health care provider.  Do not change medicines without talking with your health care provider.  Aspirin or medicines that contain aspirin may make the bleeding worse. Do not take those medicines:  During the week before your period.  During your period.  If you were prescribed iron pills, take them as told by your health care provider. Iron pills help to replace iron that your body loses because of this condition. Activity  If you need to change your sanitary pad or tampon more than one time every 2 hours:  Lie in bed with your feet raised (elevated).  Place a cold pack on your lower abdomen.  Rest as much as possible until the bleeding stops or slows down.  Do not try to lose weight until the bleeding has stopped and your blood iron level is back to normal. Other Instructions  For two months, write down:  When your period starts.  When your period ends.  When any abnormal bleeding occurs.  What problems you notice.  Keep all follow up visits as  told by your health care provider. This is important. SEEK MEDICAL CARE IF:  You get light-headed or weak.  You have nausea and vomiting.  You cannot eat or drink without vomiting.  You feel dizzy or have diarrhea while you are taking medicines.  You are taking birth control pills or hormones, and you want to change them or stop taking them. SEEK IMMEDIATE MEDICAL CARE IF:  You develop a fever or chills.  You need to change your sanitary pad or tampon more than one time per hour.  Your bleeding becomes heavier, or your flow contains clots more often.  You develop pain in your abdomen.  You lose consciousness.  You develop a rash.   This information is not intended to replace advice given to you by your health care provider. Make sure you discuss any questions you have with your health care provider.   Document Released: 06/14/2000 Document Revised: 03/08/2015 Document Reviewed: 09/12/2014 Elsevier Interactive Patient Education [employer]2016 [employer] Inc.

## 2015-12-30 NOTE — ED Notes (Signed)
C/o abd pain  "labor pains" x 1 week. Pt reports her implanon is due to be removed in September. States she has been having irregular vaginal bleeding over the last month. Also c/o rash on both arms and right foot x 3 weeks

## 2015-12-31 LAB — HIV ANTIBODY (ROUTINE TESTING W REFLEX): HIV Screen 4th Generation wRfx: NONREACTIVE

## 2015-12-31 LAB — RPR: RPR Ser Ql: NONREACTIVE

## 2016-01-01 LAB — GC/CHLAMYDIA PROBE AMP (~~LOC~~) NOT AT ARMC
Chlamydia: POSITIVE — AB
NEISSERIA GONORRHEA: NEGATIVE

## 2016-01-02 ENCOUNTER — Telehealth (HOSPITAL_BASED_OUTPATIENT_CLINIC_OR_DEPARTMENT_OTHER): Payer: Self-pay | Admitting: Emergency Medicine

## 2016-01-17 ENCOUNTER — Telehealth: Payer: Self-pay | Admitting: *Deleted

## 2016-01-17 NOTE — Telephone Encounter (Signed)
Spoke with patient, verified ID, informed of labs, treated per protocol,  patient informed to abstain for sexual activity x 10 days and notify sexual partners for evaluation and treatment

## 2016-02-03 ENCOUNTER — Emergency Department (HOSPITAL_BASED_OUTPATIENT_CLINIC_OR_DEPARTMENT_OTHER)
Admission: EM | Admit: 2016-02-03 | Discharge: 2016-02-03 | Disposition: A | Discharge status: Home / Self Care | Payer: Medicaid Other | Attending: Emergency Medicine | Admitting: Emergency Medicine

## 2016-02-03 DIAGNOSIS — N898 Other specified noninflammatory disorders of vagina: Secondary | ICD-10-CM | POA: Insufficient documentation

## 2016-02-03 DIAGNOSIS — Z202 Contact with and (suspected) exposure to infections with a predominantly sexual mode of transmission: Secondary | ICD-10-CM | POA: Insufficient documentation

## 2016-02-03 DIAGNOSIS — I10 Essential (primary) hypertension: Secondary | ICD-10-CM | POA: Insufficient documentation

## 2016-02-03 LAB — HCG, QUANTITATIVE, PREGNANCY: hCG, Beta Chain, Quant, S: <1 m[IU]/mL (ref <5)

## 2016-02-03 LAB — WET PREP, GENITAL
Clue Cells Wet Prep HPF POC: NONE SEEN
SPERM: NONE SEEN
Trich, Wet Prep: NONE SEEN
Yeast Wet Prep HPF POC: NONE SEEN

## 2016-02-03 MED ORDER — CLOTRIMAZOLE 1 % EX CREA
TOPICAL_CREAM | Freq: Once | CUTANEOUS | Status: DC
  Filled 2016-02-03: qty 15

## 2016-02-03 MED ORDER — CLOTRIMAZOLE 1 % EX CREA: TOPICAL_CREAM | CUTANEOUS | 0 refills | Status: DC

## 2016-02-03 NOTE — ED Provider Notes (Signed)
[facility] Note   CSN: [id number] Arrival date & time: 02/03/16  0206  First Provider Contact:  None       History   Chief Complaint Chief Complaint  Patient presents with  . Exposure to STD    HPI Gloria Reilly is a 20 y.o. female.  HPI Ms. Kreiser Is a 20 year old female with no significant past medical history presenting today out of concern for STD. Patient states she was recently treated because she is positive. However she had repeat temperature sex with her boyfriend again and is concerned she may have been exposed again. She denies any symptoms currently. She has no vaginal discharge, dysuria, hematuria, or abdominal pain. Patient also complains of faint rash to her bilateral hands and ankles. She denies involvement of the palms or soles. She denies any redness or drainage. There are no further complaints.  10 Systems reviewed and are negative for acute change except as noted in the HPI.     Past Medical History:  Diagnosis Date  . Hypertension 02/28/2013  . Normal labor 02/14/2013  . Postpartum care following vaginal delivery (8/17) 02/14/2013    Patient Active Problem List   Diagnosis Date Noted  . Anemia 03/06/2013  . Hypertension 02/28/2013    No past surgical history on file.  OB History    Gravida Para Term Preterm AB Living   SAB TAB Ectopic Multiple Live Births           1       Home Medications    Prior to Admission medications   Medication Sig Start Date End Date Taking? Authorizing Provider  azithromycin (ZITHROMAX Z-PAK) 250 MG tablet 2 po day one, then 1 daily x 4 days 10/16/15   Paula Libra, MD  Etonogestrel [facility]) Inject into the skin.    Historical Provider, MD  naproxen (NAPROSYN) 500 MG tablet Take 1 tablet twice daily as needed for fever or pain. 10/08/15   Paula Libra, MD    Family History Family History  Problem Relation Age of Onset  . Stroke Maternal Grandmother   . Diabetes Maternal  Grandmother   . Diabetes Maternal Grandfather     Social History Social History  Substance Use Topics  . Smoking status: Never Smoker  . Smokeless tobacco: Never Used  . Alcohol use Yes     Comment: occasional      Allergies   Review of patient's allergies indicates no known allergies.   Review of Systems Review of Systems   Physical Exam Updated Vital Signs BP (!) 142/101 (BP Location: Left Arm)   Pulse 81   Temp 98.4 F (36.9 C) (Oral)   Resp 18   Ht  (1.676 m)   Wt 165 lb (74.8 kg)   LMP 02/02/2016   SpO2 100%   BMI 26.63 kg/m   Physical Exam  Constitutional: She is oriented to person, place, and time. She appears well-developed and well-nourished. No distress.  HENT:  Head: Normocephalic and atraumatic.  Nose: Nose normal.  Mouth/Throat: Oropharynx is clear and moist. No oropharyngeal exudate.  Eyes: Conjunctivae and EOM are normal. Pupils are equal, round, and reactive to light. No scleral icterus.  Neck: Normal range of motion. Neck supple. No JVD present. No tracheal deviation present. No thyromegaly present.  Cardiovascular: Normal rate, regular rhythm and normal heart sounds.  Exam reveals no gallop and no friction rub.   No murmur heard.  Pulmonary/Chest: Effort normal and breath sounds normal. No respiratory distress. She has no wheezes. She exhibits no tenderness.  Abdominal: Soft. Bowel sounds are normal. She exhibits no distension and no mass. There is no tenderness. There is no rebound and no guarding.  Genitourinary: Vaginal discharge found.  Genitourinary Comments:  Yellow Discharge seen coming from the cervical os. No CMT or adnexal tenderness.  Musculoskeletal: Normal range of motion. She exhibits no edema or tenderness.  Lymphadenopathy:    She has no cervical adenopathy.  Neurological: She is alert and oriented to person, place, and time. No cranial nerve deficit. She exhibits normal muscle tone.  Skin: Skin is warm and dry. Rash noted.  No erythema. No pallor.  Small papules less than 5 in number seen in the bilateral wrists and bilateral ankles. No erythema, swelling, or drainage.  Nursing note and vitals reviewed.    ED Treatments / Results  Labs (all labs ordered are listed, but only abnormal results are displayed) Labs Reviewed  WET PREP, GENITAL - Abnormal; Notable for the following:       Result Value   WBC, Wet Prep HPF POC MODERATE (*)    All other components within normal limits  RAPID HIV SCREEN (HIV 1/2 AB+AG)  RPR  HCG, QUANTITATIVE, PREGNANCY  GC/CHLAMYDIA PROBE AMP ([place]) NOT AT [facility]    EKG  EKG Interpretation None       Radiology No results found.  Procedures Procedures (including critical care time)  Medications Ordered in ED Medications - No data to display   Initial Impression / Assessment and Plan / ED Course  I have reviewed the triage vital signs and the nursing notes.  Pertinent labs & imaging results that were available during my care of the patient were reviewed by me and considered in my medical decision making (see chart for details).  Clinical Course  Patient presents to the emergency department for exposure concern for STD. Pelvic exam performed blood work sent. Patient was treated with ceftriaxone azithromycin. Her rash is not consistent with syphilis or life-threatening illness such as necrotizing fasciitis.. Conservative therapy suggested. She is advised on primary care follow-up for further evaluation. She appears well in no acute distress, vital signs remain within her normal limits and she is safe for discharge.  Final Clinical Impressions(s) / ED Diagnoses   Final diagnoses:  None    New Prescriptions New Prescriptions   No medications on file     Tomasita Crumble, MD 02/03/16 [phone]

## 2016-02-03 NOTE — ED Triage Notes (Signed)
Pt reports she had recently been treated for STD and after which had sex with untreated boy friend and believes she has been re-exposed. Also states she is having rash of feet and arm and wonders if they are associated with each other

## 2016-02-04 LAB — HIV ANTIBODY (ROUTINE TESTING W REFLEX): HIV Screen 4th Generation wRfx: NONREACTIVE

## 2016-02-04 LAB — RPR: RPR: NONREACTIVE

## 2016-02-05 LAB — GC/CHLAMYDIA PROBE AMP (~~LOC~~) NOT AT ARMC
CHLAMYDIA, DNA PROBE: POSITIVE — AB
Neisseria Gonorrhea: NEGATIVE

## 2016-02-06 ENCOUNTER — Telehealth (HOSPITAL_BASED_OUTPATIENT_CLINIC_OR_DEPARTMENT_OTHER): Payer: Self-pay | Admitting: Emergency Medicine

## 2016-02-06 NOTE — Telephone Encounter (Signed)
Chart handoff to EDP for treatment plan for + Chlamydia 

## 2016-02-07 ENCOUNTER — Telehealth (HOSPITAL_BASED_OUTPATIENT_CLINIC_OR_DEPARTMENT_OTHER): Payer: Self-pay | Admitting: *Deleted

## 2016-02-08 ENCOUNTER — Telehealth (HOSPITAL_COMMUNITY): Payer: Self-pay

## 2016-02-08 NOTE — Telephone Encounter (Addendum)
Unable to reach by phone.  Letter sent to [facility] address.  DHHS form completed and faxed 

## 2016-03-13 ENCOUNTER — Emergency Department (HOSPITAL_BASED_OUTPATIENT_CLINIC_OR_DEPARTMENT_OTHER)
Admission: EM | Admit: 2016-03-13 | Discharge: 2016-03-13 | Disposition: A | Discharge status: Home / Self Care | Payer: Medicaid Other | Attending: Emergency Medicine | Admitting: Emergency Medicine

## 2016-03-13 ENCOUNTER — Encounter (HOSPITAL_BASED_OUTPATIENT_CLINIC_OR_DEPARTMENT_OTHER): Payer: Self-pay | Admitting: *Deleted

## 2016-03-13 DIAGNOSIS — A749 Chlamydial infection, unspecified: Secondary | ICD-10-CM | POA: Insufficient documentation

## 2016-03-13 DIAGNOSIS — I10 Essential (primary) hypertension: Secondary | ICD-10-CM | POA: Insufficient documentation

## 2016-03-13 MED ORDER — CEFTRIAXONE SODIUM 250 MG IJ SOLR
250.0000 mg | Freq: Once | INTRAMUSCULAR | Status: AC
  Administered 2016-03-13: 250 mg via INTRAMUSCULAR
  Filled 2016-03-13: qty 250

## 2016-03-13 MED ORDER — AZITHROMYCIN 250 MG PO TABS
1000.0000 mg | ORAL_TABLET | Freq: Once | ORAL | Status: AC
  Administered 2016-03-13: 1000 mg via ORAL
  Filled 2016-03-13: qty 4

## 2016-03-13 NOTE — ED Triage Notes (Signed)
Pt seen here 8/5 for STD check, DX chlamydia here for treatment

## 2016-03-13 NOTE — ED Provider Notes (Signed)
[facility] Provider Note   CSN: [id number] Arrival date & time: 03/13/16  1546  By signing my name below, I, Christel MormonMatthew Jamison, attest that this documentation has been prepared under the direction and in the presence of Arvilla MeresAshley Meyer, PA-C. Electronically Signed: Christel MormonMatthew Jamison, Scribe. 03/13/2016. 4:11 PM.   History   Chief Complaint Chief Complaint  Patient presents with  . SEXUALLY TRANSMITTED DISEASE    The history is provided by the patient. No language interpreter was used.   HPI Comments:  Gloria Reilly is a 20 y.o. female who presents to the Emergency Department seeking treatment for chlamydia. Pt reports that she was seen here ~1 month ago for treatment with associated abdominal pain and vaginal discharge. Pt states that she had vaginal discharge when she had the abdominal pain 1 month ago, but now she is baseline. Pt denies any current abdominal pain, urinary symptoms, current vaginal discharge, fever, chills, or pain with intercourse. Patient apparently was diagnosed with chlamydia. She was never given a treatment while in emergency department, her test came back later, and according to Epic, we were unable to get in touch with her. Patient now found out that she has chlamydia, is here for treatment.  Past Medical History:  Diagnosis Date  . Hypertension 02/28/2013  . Normal labor 02/14/2013  . Postpartum care following vaginal delivery (8/17) 02/14/2013    Patient Active Problem List   Diagnosis Date Noted  . Anemia 03/06/2013  . Hypertension 02/28/2013    History reviewed. No pertinent surgical history.  OB History    Gravida Para Term Preterm AB Living   1 1 1     1    SAB TAB Ectopic Multiple Live Births           1       Home Medications    Prior to Admission medications   Medication Sig Start Date End Date Taking? Authorizing Provider  Etonogestrel (IMPLANON [place]) Inject into the skin.    Historical Provider, MD    Family History Family  History  Problem Relation Age of Onset  . Stroke Maternal Grandmother   . Diabetes Maternal Grandmother   . Diabetes Maternal Grandfather     Social History Social History  Substance Use Topics  . Smoking status: Never Smoker  . Smokeless tobacco: Never Used  . Alcohol use Yes     Comment: occasional      Allergies   Review of patient's allergies indicates no known allergies.   Review of Systems Review of Systems  Constitutional: Negative for diaphoresis and fever.  Gastrointestinal: Negative for abdominal pain.  Genitourinary: Negative for dysuria and vaginal discharge.  All other systems reviewed and are negative.    Physical Exam Updated Vital Signs BP 154/98 (BP Location: Left Arm)   Pulse 75   Temp 98.3 F (36.8 C) (Oral)   Resp 16   Ht 5\' 6"  (1.676 m)   Wt 77.1 kg   SpO2 100%   BMI 27.44 kg/m   Physical Exam  Constitutional: She appears well-developed and well-nourished. No distress.  HENT:  Head: Normocephalic and atraumatic.  Eyes: Conjunctivae are normal.  Cardiovascular: Normal rate.   Pulmonary/Chest: Effort normal.  Abdominal: She exhibits no distension.  Neurological: She is alert.  Skin: Skin is warm and dry.  Psychiatric: She has a normal mood and affect.  Nursing note and vitals reviewed.    ED Treatments / Results  DIAGNOSTIC STUDIES:  Oxygen Saturation is 100% on RA, normal by  my interpretation.    COORDINATION OF CARE:  4:30 PM Discussed treatment plan with pt at bedside and pt agreed to plan.   Labs (all labs ordered are listed, but only abnormal results are displayed) Labs Reviewed - No data to display  EKG  EKG Interpretation None       Radiology No results found.  Procedures Procedures (including critical care time)  Medications Ordered in ED Medications  cefTRIAXone (ROCEPHIN) injection 250 mg (250 mg Intramuscular Given 03/13/16 1623)  azithromycin (ZITHROMAX) tablet 1,000 mg (1,000 mg Oral Given 03/13/16  1623)     Initial Impression / Assessment and Plan / ED Course  I have reviewed the triage vital signs and the nursing notes.  Pertinent labs & imaging results that were available during my care of the patient were reviewed by me and considered in my medical decision making (see chart for details).  Clinical Course   Patient had positive chlamydia test on 02/03/2016. She is here for treatment. She never received treatment prior to today. Treated with Rocephin 250 mg IM, Zithromax 1 g by mouth. No intercourse for one week. Instructed to get her partner treated as well. Follow with primary care doctor  Vitals:   03/13/16 1552 03/13/16 1554  BP:  154/98  Pulse:  75  Resp:  16  Temp:  98.3 F (36.8 C)  TempSrc:  Oral  SpO2:  100%  Weight: 77.1 kg   Height: 5\' 6"  (1.676 m)      Final Clinical Impressions(s) / ED Diagnoses   Final diagnoses:  Chlamydia    New Prescriptions New Prescriptions   No medications on file    I personally performed the services described in this documentation, which was scribed in my presence. The recorded information has been reviewed and is accurate.     Jaynie Crumble, PA-C 03/13/16 1630    Vanetta Mulders, MD 03/13/16 1652

## 2016-03-13 NOTE — Discharge Instructions (Signed)
Avoid intercourse for a week. Make sure your partner gets treated as well. Follow up with health department or primary care doctor as needed.

## 2016-04-17 ENCOUNTER — Telehealth (HOSPITAL_BASED_OUTPATIENT_CLINIC_OR_DEPARTMENT_OTHER): Payer: Self-pay | Admitting: Emergency Medicine

## 2016-04-17 NOTE — Telephone Encounter (Signed)
Lost to followup 

## 2016-07-30 ENCOUNTER — Encounter (HOSPITAL_BASED_OUTPATIENT_CLINIC_OR_DEPARTMENT_OTHER): Payer: Self-pay | Admitting: *Deleted

## 2016-07-30 ENCOUNTER — Emergency Department (HOSPITAL_BASED_OUTPATIENT_CLINIC_OR_DEPARTMENT_OTHER)
Admission: EM | Admit: 2016-07-30 | Discharge: 2016-07-30 | Disposition: A | Discharge status: Home / Self Care | Payer: Medicaid Other | Attending: Emergency Medicine | Admitting: Emergency Medicine

## 2016-07-30 DIAGNOSIS — Z79899 Other long term (current) drug therapy: Secondary | ICD-10-CM | POA: Insufficient documentation

## 2016-07-30 DIAGNOSIS — F17210 Nicotine dependence, cigarettes, uncomplicated: Secondary | ICD-10-CM | POA: Insufficient documentation

## 2016-07-30 DIAGNOSIS — M79601 Pain in right arm: Secondary | ICD-10-CM

## 2016-07-30 DIAGNOSIS — I10 Essential (primary) hypertension: Secondary | ICD-10-CM | POA: Insufficient documentation

## 2016-07-30 NOTE — ED Triage Notes (Signed)
C/o right arm pain x 1 month, states it is in arm where birth control is implanted. Area is inner right arm. No redness or swelling noted. States it expired 1 month ago.

## 2016-07-30 NOTE — ED Provider Notes (Signed)
[facility]   CSN: [id number] Arrival date & time: 07/30/16  0909     History   Chief Complaint Chief Complaint  Patient presents with  . Arm Pain    HPI Gloria Reilly is a 21 y.o. female.  HPI Gloria Reilly is a 21 y.o. female presents to emergency department complaining of right upper arm pain. Patient states she has pain and tenderness over her Implanon which is in her right upper arm. She states she has received it 3 years ago, and it expired a month ago. She states that she was supposed to get it out, but she called her OB/GYN and they did not have any appointments for 3 weeks. Patient decided to come here to see if we would remove her Implanon. She denies any redness or swelling around the site. She denies any injuries. She denies any numbness or tingling down her right arm. She has not tried any medications to help her with pain.  Past Medical History:  Diagnosis Date  . Hypertension 02/28/2013  . Normal labor 02/14/2013  . Postpartum care following vaginal delivery (8/17) 02/14/2013    Patient Active Problem List   Diagnosis Date Noted  . Anemia 03/06/2013  . Hypertension 02/28/2013    History reviewed. No pertinent surgical history.  OB History    Gravida Para Term Preterm AB Living   1 1 1     1    SAB TAB Ectopic Multiple Live Births           1       Home Medications    Prior to Admission medications   Medication Sig Start Date End Date Taking? Authorizing Provider  Etonogestrel (IMPLANON [place]) Inject into the skin.   Yes Historical Provider, MD    Family History Family History  Problem Relation Age of Onset  . Stroke Maternal Grandmother   . Diabetes Maternal Grandmother   . Diabetes Maternal Grandfather     Social History Social History  Substance Use Topics  . Smoking status: Current Every Day Smoker    Packs/day: 0.50    Types: Cigarettes  . Smokeless tobacco: Never Used  . Alcohol use Yes     Comment: occasional        Allergies   Patient has no known allergies.   Review of Systems Review of Systems  Constitutional: Negative for chills and fever.  Musculoskeletal: Positive for arthralgias and myalgias.  Neurological: Negative for weakness and numbness.     Physical Exam Updated Vital Signs BP 126/80 (BP Location: Left Arm)   Pulse 88   Temp 98.9 F (37.2 C) (Oral)   Resp 16   LMP 07/01/2016   SpO2 100%   Physical Exam  Constitutional: She appears well-developed and well-nourished. No distress.  Eyes: Conjunctivae are normal.  Neck: Neck supple.  Musculoskeletal:  Implanon is palpable in the right medial upper arm. There is no erythema, swelling, bruising around the site. Normal right arm otherwise. Distal radial pulses intact.  Neurological: She is alert.  Skin: Skin is warm and dry.  Nursing note and vitals reviewed.    ED Treatments / Results  Labs (all labs ordered are listed, but only abnormal results are displayed) Labs Reviewed - No data to display  EKG  EKG Interpretation None       Radiology No results found.  Procedures Procedures (including critical care time)  Medications Ordered in ED Medications - No data to display   Initial Impression / Assessment  and Plan / ED Course  I have reviewed the triage vital signs and the nursing notes.  Pertinent labs & imaging results that were available during my care of the patient were reviewed by me and considered in my medical decision making (see chart for details).   patient with tenderness and pain around Implanon site that she has had in her arm for 3 years. She believes it is painful because it is expired. I explained to her that that it is expired should not be causing her to have pain in question may be possible injury to the arm. At this point in time there is no evidence of infection, there is no other findings on exam that would need emergent treatment. Will discharge home with Tylenol, Motrin, ice pack,  follow up with her OB/GYN to have her Implanon removed.   Vitals:   07/30/16 0922  BP: 126/80  Pulse: 88  Resp: 16  Temp: 98.9 F (37.2 C)  TempSrc: Oral  SpO2: 100%    Final Clinical Impressions(s) / ED Diagnoses   Final diagnoses:  Right arm pain    New Prescriptions New Prescriptions   No medications on file     Jaynie Crumbleatyana Debbora Ang, PA-C 07/30/16 [phone]    Melene Planan Floyd, DO 07/30/16 1058

## 2016-07-30 NOTE — Discharge Instructions (Signed)
Apply ice pack to the arm several times a day. Take ibuprofen or Tylenol for pain. Follow-up with your OB/GYN to have your Implanon removed.

## 2016-08-04 ENCOUNTER — Emergency Department (HOSPITAL_BASED_OUTPATIENT_CLINIC_OR_DEPARTMENT_OTHER): Payer: Self-pay

## 2016-08-04 ENCOUNTER — Emergency Department (HOSPITAL_BASED_OUTPATIENT_CLINIC_OR_DEPARTMENT_OTHER)
Admission: EM | Admit: 2016-08-04 | Discharge: 2016-08-05 | Disposition: A | Discharge status: Home / Self Care | Payer: Self-pay | Attending: Emergency Medicine | Admitting: Emergency Medicine

## 2016-08-04 ENCOUNTER — Encounter (HOSPITAL_BASED_OUTPATIENT_CLINIC_OR_DEPARTMENT_OTHER): Payer: Self-pay

## 2016-08-04 DIAGNOSIS — I10 Essential (primary) hypertension: Secondary | ICD-10-CM | POA: Insufficient documentation

## 2016-08-04 DIAGNOSIS — F17290 Nicotine dependence, other tobacco product, uncomplicated: Secondary | ICD-10-CM | POA: Insufficient documentation

## 2016-08-04 DIAGNOSIS — J209 Acute bronchitis, unspecified: Secondary | ICD-10-CM | POA: Insufficient documentation

## 2016-08-04 NOTE — ED Triage Notes (Signed)
Reports cough x4 days.

## 2016-08-04 NOTE — ED Provider Notes (Signed)
[facility]   CSN: [id number] Arrival date & time: 08/04/16  2310  By signing my name below, I, Alyssa Grove, attest that this documentation has been prepared under the direction and in the presence of Renne Crigler, PA-C. Electronically Signed: Alyssa Grove, ED Scribe. 08/04/16. 11:39 PM.   History   Chief Complaint Chief Complaint  Patient presents with  . Cough   The history is provided by the patient. No language interpreter was used.   HPI Comments: Gloria Reilly is a 21 y.o. female with PMHx of HTN who presents to the Emergency Department complaining of gradual onset, persistent, moderate productive cough (clear/white and yellow) for 4 days. She states her cough has gradually become dry. She states occasionally she noticed droplets of blood in phlegm. She reports associated nasal congestion. Pt denies known sick contact. She denies fever, wheezing, vomiting, diarrhea, abdominal pain, dysuria, hematuria. H/o implanon.   Past Medical History:  Diagnosis Date  . Hypertension 02/28/2013  . Normal labor 02/14/2013  . Postpartum care following vaginal delivery (8/17) 02/14/2013    Patient Active Problem List   Diagnosis Date Noted  . Anemia 03/06/2013  . Hypertension 02/28/2013    History reviewed. No pertinent surgical history.  OB History    Gravida Para Term Preterm AB Living   1 1 1     1    SAB TAB Ectopic Multiple Live Births           1       Home Medications    Prior to Admission medications   Medication Sig Start Date End Date Taking? Authorizing Provider  Etonogestrel (IMPLANON [place]) Inject into the skin.    Historical Provider, MD    Family History Family History  Problem Relation Age of Onset  . Stroke Maternal Grandmother   . Diabetes Maternal Grandmother   . Diabetes Maternal Grandfather     Social History Social History  Substance Use Topics  . Smoking status: Current Every Day Smoker    Packs/day: 0.50    Types: Cigars    . Smokeless tobacco: Never Used  . Alcohol use Yes     Comment: occasional      Allergies   Patient has no known allergies.   Review of Systems Review of Systems  Constitutional: Negative for chills, fatigue and fever.  HENT: Positive for congestion. Negative for ear pain, rhinorrhea, sinus pressure and sore throat.   Eyes: Negative for redness.  Respiratory: Positive for cough. Negative for wheezing.   Cardiovascular: Negative for chest pain.  Gastrointestinal: Negative for abdominal pain, diarrhea, nausea and vomiting.  Genitourinary: Negative for dysuria and hematuria.  Musculoskeletal: Negative for myalgias and neck stiffness.  Skin: Negative for rash.  Neurological: Negative for headaches.  Hematological: Negative for adenopathy.     Physical Exam Updated Vital Signs BP 142/100 (BP Location: Left Arm)   Pulse 89   Temp 98.4 F (36.9 C) (Oral)   Resp 18   Ht 5\' 7"  (1.702 m)   Wt 172 lb (78 kg)   LMP 07/04/2016   SpO2 98%   BMI 26.94 kg/m   Physical Exam  Constitutional: She appears well-developed and well-nourished.  HENT:  Head: Normocephalic and atraumatic.  Right Ear: Tympanic membrane, external ear and ear canal normal.  Left Ear: Tympanic membrane, external ear and ear canal normal.  Nose: Nose normal. No mucosal edema or rhinorrhea.  Mouth/Throat: Uvula is midline, oropharynx is clear and moist and mucous membranes are normal. Mucous  membranes are not dry. No oral lesions. No trismus in the jaw. No uvula swelling. No oropharyngeal exudate, posterior oropharyngeal edema, posterior oropharyngeal erythema or tonsillar abscesses.  Eyes: Conjunctivae are normal. Right eye exhibits no discharge. Left eye exhibits no discharge.  Neck: Normal range of motion. Neck supple.  Cardiovascular: Normal rate, regular rhythm and normal heart sounds.   Pulmonary/Chest: Effort normal and breath sounds normal. No respiratory distress. She has no wheezes. She has no rales.   Abdominal: Soft. There is no tenderness.  Lymphadenopathy:    She has no cervical adenopathy.  Neurological: She is alert.  Skin: Skin is warm and dry.  Psychiatric: She has a normal mood and affect.  Nursing note and vitals reviewed.    ED Treatments / Results  DIAGNOSTIC STUDIES: Oxygen Saturation is 98% on RA, normal by my interpretation.    COORDINATION OF CARE: 11:37 PM Discussed treatment plan with pt at bedside which includes Chest XR and pt agreed to plan.  Radiology Dg Chest 2 View  Result Date: 08/05/2016 CLINICAL DATA:  Cough and nasal congestion for 4 days EXAM: CHEST  2 VIEW COMPARISON:  None. FINDINGS: The heart size and mediastinal contours are within normal limits. Both lungs are clear. The visualized skeletal structures are unremarkable. IMPRESSION: No active cardiopulmonary disease. Electronically Signed   By: Jasmine PangKim  Fujinaga M.D.   On: 08/05/2016 00:02    Procedures Procedures (including critical care time)  Medications Ordered in ED Medications - No data to display   Initial Impression / Assessment and Plan / ED Course  I have reviewed the triage vital signs and the nursing notes.  Pertinent labs & imaging results that were available during my care of the patient were reviewed by me and considered in my medical decision making (see chart for details).     Vital signs reviewed and are as follows: Vitals:   08/04/16 2315  BP: 142/100  Pulse: 89  Resp: 18  Temp: 98.4 F (36.9 C)   12:04 AM Chest x-ray clear. Will treat as acute bronchitis. Pt given tessalon for cough.   Patient counseled on supportive care for viral URI and s/s to return including worsening symptoms, persistent fever, persistent vomiting, or if they have any other concerns. Urged to see PCP if symptoms persist for more than 3 days. Patient verbalizes understanding and agrees with plan.   I personally performed the services described in this documentation, which was scribed in my  presence. The recorded information has been reviewed and is accurate.   Final Clinical Impressions(s) / ED Diagnoses   Final diagnoses:  Acute bronchitis, unspecified organism   Patient with likely viral bronchitis. No concern for PNA given normal lung exam/x-ray. Antibiotics not indicated. Conservative therapy indicated. Possible slight hemoptysis. VS WNL. Exam not clinically concerning for PE.   New Prescriptions New Prescriptions   BENZONATATE (TESSALON) 100 MG CAPSULE    Take 1 capsule (100 mg total) by mouth every 8 (eight) hours.     Renne CriglerJoshua Kenecia Barren, PA-C 08/05/16 0006    Paula LibraJohn Molpus, MD 08/05/16 [phone]

## 2016-08-04 NOTE — ED Notes (Signed)
Pt states she has had a cough x 4 days. Occasionally notices clr slightly blood tinged sputum. Also c/o congestion.

## 2016-08-05 MED ORDER — BENZONATATE 100 MG PO CAPS: 100.0000 mg | ORAL_CAPSULE | Freq: Three times a day (TID) | ORAL | 0 refills | Status: DC

## 2016-08-05 NOTE — Discharge Instructions (Signed)
Please read and follow all provided instructions.  Your diagnoses today include:  1. Acute bronchitis, unspecified organism    Tests performed today include:  Chest x-ray - does not show any pneumonia  Vital signs. See below for your results today.   Medications prescribed:   Tessalon Perles - cough suppressant medication  Take any prescribed medications only as directed.  Home care instructions:  Follow any educational materials contained in this packet.  Follow-up instructions: Please follow-up with your primary care provider in the next 3 days for further evaluation of your symptoms and a recheck if you are not feeling better.   Return instructions:   Please return to the Emergency Department if you experience worsening symptoms.  Please return with worsening wheezing, shortness of breath, or difficulty breathing.  Return with persistent fever above 101F.   Please return if you have any other emergent concerns.  Additional Information:  Your vital signs today were: BP 142/100 (BP Location: Left Arm)    Pulse 89    Temp 98.4 F (36.9 C) (Oral)    Resp 18    Ht 5\' 7"  (1.702 m)    Wt 78 kg    LMP 07/04/2016    SpO2 98%    BMI 26.94 kg/m  If your blood pressure (BP) was elevated above 135/85 this visit, please have this repeated by your doctor within one month. --------------

## 2017-01-17 ENCOUNTER — Encounter (HOSPITAL_BASED_OUTPATIENT_CLINIC_OR_DEPARTMENT_OTHER): Payer: Self-pay | Admitting: *Deleted

## 2017-01-17 ENCOUNTER — Emergency Department (HOSPITAL_BASED_OUTPATIENT_CLINIC_OR_DEPARTMENT_OTHER)
Admission: EM | Admit: 2017-01-17 | Discharge: 2017-01-17 | Disposition: A | Discharge status: Home / Self Care | Payer: Medicaid Other | Attending: Emergency Medicine | Admitting: Emergency Medicine

## 2017-01-17 DIAGNOSIS — Z79899 Other long term (current) drug therapy: Secondary | ICD-10-CM | POA: Insufficient documentation

## 2017-01-17 DIAGNOSIS — F17290 Nicotine dependence, other tobacco product, uncomplicated: Secondary | ICD-10-CM | POA: Insufficient documentation

## 2017-01-17 DIAGNOSIS — M25561 Pain in right knee: Secondary | ICD-10-CM | POA: Insufficient documentation

## 2017-01-17 DIAGNOSIS — I10 Essential (primary) hypertension: Secondary | ICD-10-CM | POA: Insufficient documentation

## 2017-01-17 LAB — D-DIMER, QUANTITATIVE (NOT AT ARMC): D DIMER QUANT: 0.34 ug{FEU}/mL (ref 0.00–0.50)

## 2017-01-17 MED ORDER — NAPROXEN 125 MG/5ML PO SUSP: 375.0000 mg | Freq: Two times a day (BID) | ORAL | 0 refills | Status: DC | PRN

## 2017-01-17 MED ORDER — NAPROXEN 250 MG PO TABS
500.0000 mg | ORAL_TABLET | Freq: Once | ORAL | Status: DC
  Filled 2017-01-17: qty 2

## 2017-01-17 NOTE — ED Notes (Signed)
Nurse went to evaluate the patient and she is sound asleep as well as her visitor.  Attempted waking patient without success.  Guardrail up and call light within reach.

## 2017-01-17 NOTE — ED Provider Notes (Signed)
[facility] Provider Note: Lowella DellJ. Lane Elson Ulbrich, MD, FACEP  CSN: [id number] MRN: [id number] ARRIVAL: 01/17/17 at 0150 ROOM: [facility]01/[facility]01   CHIEF COMPLAINT  Knee Pain   HISTORY OF PRESENT ILLNESS  Gloria Reilly is a 21 y.o. female who was having pain in her right thigh yesterday at about the greater trochanter. She describes it as a throbbing achy pain. She has subsequently developed pain in her right knee with resolution of the thigh pain. It is vaguely described as a throbbing sensation. It is worse with weightbearing. She rates it as a 6 out of 10. She localizes the pain primarily to the infrapatellar and popliteal areas. She denies injury. There is no associated calf swelling or pain. She denies recent travel. She denies chest pain or shortness of breath.   Past Medical History:  Diagnosis Date  . Hypertension 02/28/2013  . Normal labor 02/14/2013  . Postpartum care following vaginal delivery (8/17) 02/14/2013    History reviewed. No pertinent surgical history.  Family History  Problem Relation Age of Onset  . Stroke Maternal Grandmother   . Diabetes Maternal Grandmother   . Diabetes Maternal Grandfather     Social History  Substance Use Topics  . Smoking status: Current Every Day Smoker    Packs/day: 0.50    Types: Cigars  . Smokeless tobacco: Never Used  . Alcohol use Yes     Comment: occasional     Prior to Admission medications   Medication Sig Start Date End Date Taking? Authorizing Provider  Etonogestrel (IMPLANON [place]) Inject into the skin.    [provider]    Allergies Patient has no known allergies.   REVIEW OF SYSTEMS  Negative except as noted here or in the History of Present Illness.   PHYSICAL EXAMINATION  Initial Vital Signs Blood pressure 130/89, pulse 79, temperature 99 F (37.2 C), temperature source Oral, resp. rate 16, height 5\' 6"  (1.676 m), weight 77.1 kg (170 lb), last menstrual period 12/23/2016, SpO2 100  %.  Examination General: Well-developed, well-nourished female in no acute distress; appearance consistent with age of record HENT: normocephalic; atraumatic Eyes: Normal appearance Neck: supple Heart: regular rate and rhythm Lungs: clear to auscultation bilaterally Abdomen: soft; nondistended; nontender; bowel sounds present Extremities: No deformity; full range of motion; pulses normal; right knee stable with minimal tenderness on exam; no edema Neurologic: Awake, alert and oriented; motor function intact in all extremities and symmetric; no facial droop Skin: Warm and dry Psychiatric: Normal mood and affect   RESULTS  Summary of this visit's results, reviewed by myself:   EKG Interpretation  Date/Time:    Ventricular Rate:    PR Interval:    QRS Duration:   QT Interval:    QTC Calculation:   R Axis:     Text Interpretation:        Laboratory Studies: Results for orders placed or performed during the hospital encounter of 01/17/17 (from the past 24 hour(s))  D-dimer, quantitative (not [facility])     Status: None   Collection Time: 01/17/17  3:52 AM  Result Value Ref Range   D-Dimer, Quant 0.34 0.00 - 0.50 ug/mL-FEU   Imaging Studies: No results found.  ED COURSE  Nursing notes and initial vitals signs, including pulse oximetry, reviewed.  Vitals:   01/17/17 0158 01/17/17 0201  BP:  130/89  Pulse:  79  Resp:  16  Temp:  99 F (37.2 C)  TempSrc:  Oral  SpO2:  100%  Weight: 77.1  kg (170 lb)   Height: 5\' 6"  (1.676 m)    4:16 AM Patient refused oral naproxen in ED because she does not like to swallow pills.  PROCEDURES    ED DIAGNOSES     ICD-10-CM   1. Acute pain of right knee M25.561        Beverlie Kurihara, Jonny Ruiz, MD 01/17/17 [phone]

## 2017-01-17 NOTE — ED Notes (Signed)
Pt verbalizes understanding of d/c instructions and denies any further need at this time. 

## 2017-01-17 NOTE — ED Triage Notes (Signed)
Pt with sharp right hip pain that started yesterday noon. Pain then radiated down to right knee where it has been constant throbbing. Denies known injury denies continued hip pain

## 2017-01-31 ENCOUNTER — Encounter (HOSPITAL_BASED_OUTPATIENT_CLINIC_OR_DEPARTMENT_OTHER): Payer: Self-pay

## 2017-01-31 ENCOUNTER — Emergency Department (HOSPITAL_BASED_OUTPATIENT_CLINIC_OR_DEPARTMENT_OTHER)
Admission: EM | Admit: 2017-01-31 | Discharge: 2017-02-01 | Disposition: A | Discharge status: Home / Self Care | Payer: Medicaid Other | Attending: Emergency Medicine | Admitting: Emergency Medicine

## 2017-01-31 DIAGNOSIS — J029 Acute pharyngitis, unspecified: Secondary | ICD-10-CM | POA: Insufficient documentation

## 2017-01-31 DIAGNOSIS — Z87891 Personal history of nicotine dependence: Secondary | ICD-10-CM | POA: Insufficient documentation

## 2017-01-31 DIAGNOSIS — R509 Fever, unspecified: Secondary | ICD-10-CM | POA: Insufficient documentation

## 2017-01-31 DIAGNOSIS — I10 Essential (primary) hypertension: Secondary | ICD-10-CM | POA: Insufficient documentation

## 2017-01-31 DIAGNOSIS — H65191 Other acute nonsuppurative otitis media, right ear: Secondary | ICD-10-CM | POA: Insufficient documentation

## 2017-01-31 LAB — RAPID STREP SCREEN (MED CTR MEBANE ONLY): STREPTOCOCCUS, GROUP A SCREEN (DIRECT): NEGATIVE

## 2017-01-31 MED ORDER — NAPROXEN 375 MG PO TABS: 375.0000 mg | ORAL_TABLET | Freq: Two times a day (BID) | ORAL | 0 refills | Status: DC | PRN

## 2017-01-31 MED ORDER — FLUCONAZOLE 150 MG PO TABS: ORAL_TABLET | ORAL | 0 refills | Status: DC

## 2017-01-31 MED ORDER — AMOXICILLIN 500 MG PO CAPS
1000.0000 mg | ORAL_CAPSULE | Freq: Once | ORAL | Status: AC
  Administered 2017-01-31: 1000 mg via ORAL
  Filled 2017-01-31: qty 2

## 2017-01-31 MED ORDER — NAPROXEN 250 MG PO TABS
500.0000 mg | ORAL_TABLET | Freq: Once | ORAL | Status: AC
  Administered 2017-01-31: 500 mg via ORAL
  Filled 2017-01-31: qty 2

## 2017-01-31 MED ORDER — PENICILLIN G BENZATHINE 1200000 UNIT/2ML IM SUSP
1.2000 10*6.[IU] | Freq: Once | INTRAMUSCULAR | Status: AC
  Administered 2017-01-31: 1.2 10*6.[IU] via INTRAMUSCULAR
  Filled 2017-01-31: qty 2

## 2017-01-31 MED ORDER — AMOXICILLIN 500 MG PO CAPS: 1000.0000 mg | ORAL_CAPSULE | Freq: Three times a day (TID) | ORAL | 0 refills | Status: DC

## 2017-01-31 NOTE — ED Triage Notes (Signed)
C/o flu like sx started last night-NAD-steady gait 

## 2017-01-31 NOTE — ED Provider Notes (Signed)
[facility] Provider Note: Lowella DellJ. Lane Everton Bertha, MD, FACEP  CSN: [id number] MRN: [id number] ARRIVAL: 01/31/17 at 2137 ROOM: MH11/MH11   CHIEF COMPLAINT  Ear Pain   HISTORY OF PRESENT ILLNESS  01/31/17 11:28 PM Gloria DukeJhakya Reilly is a 21 y.o. female who complains of bilateral ear pain, right greater than left, and sore throat since yesterday. She has had an associated low-grade fever. She rates her pain as a 6 out of 10. She denies nasal congestion, rhinorrhea, cough, difficulty breathing, nausea, vomiting or diarrhea. She also feels pain in the muscles of her neck, worse with movement or palpation. She has not taken anything for her symptoms.   Past Medical History:  Diagnosis Date  . Hypertension 02/28/2013  . Normal labor 02/14/2013  . Postpartum care following vaginal delivery (8/17) 02/14/2013    History reviewed. No pertinent surgical history.  Family History  Problem Relation Age of Onset  . Stroke Maternal Grandmother   . Diabetes Maternal Grandmother   . Diabetes Maternal Grandfather     Social History  Substance Use Topics  . Smoking status: Former Smoker    Packs/day: 0.00    Quit date: 01/17/2017  . Smokeless tobacco: Never Used  . Alcohol use Yes     Comment: occasional     Prior to Admission medications   Medication Sig Start Date End Date Taking? Authorizing Provider  Etonogestrel (IMPLANON [place]) Inject into the skin.    [provider]    Allergies Patient has no known allergies.   REVIEW OF SYSTEMS  Negative except as noted here or in the History of Present Illness.   PHYSICAL EXAMINATION  Initial Vital Signs Blood pressure 136/89, pulse (!) 110, temperature 100.1 F (37.8 C), temperature source Oral, resp. rate 20, height 5\' 6"  (1.676 m), weight 84.4 kg (186 lb), last menstrual period 01/24/2017, SpO2 98 %.  Examination General: Well-developed, well-nourished female in no acute distress; appearance consistent with age of record HENT:  normocephalic; atraumatic; mild pharyngeal erythema without exudate; left TM normal, right TM erythematous Eyes: pupils equal, round and reactive to light; extraocular muscles intact Neck: supple; mild tenderness of musculature without palpable lymphadenopathy Heart: regular rate and rhythm Lungs: clear to auscultation bilaterally Abdomen: soft; nondistended; nontender; bowel sounds present Extremities: No deformity; full range of motion; pulses normal Neurologic: Awake, alert and oriented; motor function intact in all extremities and symmetric; no facial droop Skin: Warm and dry Psychiatric: Normal mood and affect   RESULTS  Summary of this visit's results, reviewed by myself:   EKG Interpretation  Date/Time:    Ventricular Rate:    PR Interval:    QRS Duration:   QT Interval:    QTC Calculation:   R Axis:     Text Interpretation:        Laboratory Studies: Results for orders placed or performed during the hospital encounter of 01/31/17 (from the past 24 hour(s))  Rapid strep screen     Status: None   Collection Time: 01/31/17 10:30 PM  Result Value Ref Range   Streptococcus, Group A Screen (Direct) NEGATIVE NEGATIVE   Imaging Studies: No results found.  ED COURSE  Nursing notes and initial vitals signs, including pulse oximetry, reviewed.  Vitals:   01/31/17 2148  BP: 136/89  Pulse: (!) 110  Resp: 20  Temp: 100.1 F (37.8 C)  TempSrc: Oral  SpO2: 98%  Weight: 84.4 kg (186 lb)  Height: 5\' 6"  (1.676 m)    PROCEDURES    ED  DIAGNOSES     ICD-10-CM   1. Otitis media, non-suppurative, acute, right H65.191        Gloria Slaugh, MD 01/31/17 [phone]

## 2017-02-03 ENCOUNTER — Encounter (HOSPITAL_BASED_OUTPATIENT_CLINIC_OR_DEPARTMENT_OTHER): Payer: Self-pay | Admitting: Emergency Medicine

## 2017-02-03 ENCOUNTER — Emergency Department (HOSPITAL_BASED_OUTPATIENT_CLINIC_OR_DEPARTMENT_OTHER)
Admission: EM | Admit: 2017-02-03 | Discharge: 2017-02-03 | Disposition: A | Discharge status: Home / Self Care | Payer: Self-pay | Attending: Emergency Medicine | Admitting: Emergency Medicine

## 2017-02-03 DIAGNOSIS — Z87891 Personal history of nicotine dependence: Secondary | ICD-10-CM | POA: Insufficient documentation

## 2017-02-03 DIAGNOSIS — H66003 Acute suppurative otitis media without spontaneous rupture of ear drum, bilateral: Secondary | ICD-10-CM | POA: Insufficient documentation

## 2017-02-03 DIAGNOSIS — I10 Essential (primary) hypertension: Secondary | ICD-10-CM | POA: Insufficient documentation

## 2017-02-03 LAB — CULTURE, GROUP A STREP (THRC)

## 2017-02-03 MED ORDER — AMOXICILLIN-POT CLAVULANATE 875-125 MG PO TABS: 1.0000 | ORAL_TABLET | Freq: Two times a day (BID) | ORAL | 0 refills | Status: DC

## 2017-02-03 MED ORDER — NEOMYCIN-POLYMYXIN-HC 1 % OT SOLN: 3.0000 [drp] | Freq: Four times a day (QID) | OTIC | 0 refills | Status: DC

## 2017-02-03 MED ORDER — CETIRIZINE-PSEUDOEPHEDRINE ER 5-120 MG PO TB12: 1.0000 | ORAL_TABLET | Freq: Two times a day (BID) | ORAL | 0 refills | Status: DC

## 2017-02-03 MED FILL — NEO/POLYMYXIN/HC EAR SOLN: 3.5-10000-1 | 17 days supply | Qty: 10 | Fill #0

## 2017-02-03 MED FILL — AMOX-CLAV 875-125 MG TABLET: 875-125 | 7 days supply | Qty: 14 | Fill #0

## 2017-02-03 NOTE — Discharge Instructions (Signed)
Medications: Augmentin, Zyrtec-D, Cortisporin  Treatment: Take Augmentin twice daily for 7 days. Take Zyrtec D twice daily for your congestion and ear pressure. Apply 3 drops of Cortisporin 4 times daily for 7 days. Avoid using any other drops including a sweet oil you were using.  Follow-up: Please return to the emergency department if you develop any new or worsening symptoms. If your symptoms are not improving in 7-10 days, please follow-up with the ear nose and throat doctor, Dr. Annalee GentaShoemaker, for further evaluation and treatment.

## 2017-02-03 NOTE — ED Triage Notes (Signed)
Reports bilateral ear pain after being seen on 8/3.  States she has treated ears with sweet oil and now feels like her ears are "clogged up".

## 2017-02-03 NOTE — ED Provider Notes (Signed)
[facility]   CSN: [id number] Arrival date & time: 02/03/17  0102     History   Chief Complaint Chief Complaint  Patient presents with  . Otalgia    HPI Gloria Reilly is a 21 y.o. female who was evaluated on 01/31/2017 ear pain. Patient was treated with penicillin. Patient reports she has had worsening symptoms over the past few days after putting "sweet oil" in her ears to help her ear pain. She now feels a clogged sensation in both ears. She has also developed in the past few days as a congestion and rhinorrhea. She has been taking Advil which does help her pain. She denies any fevers or cough, chest pain, shortness of breath, abdominal pain, nausea, vomiting.  HPI  Past Medical History:  Diagnosis Date  . Hypertension 02/28/2013  . Normal labor 02/14/2013  . Postpartum care following vaginal delivery (8/17) 02/14/2013    Patient Active Problem List   Diagnosis Date Noted  . Anemia 03/06/2013  . Hypertension 02/28/2013    History reviewed. No pertinent surgical history.  OB History    Gravida Para Term Preterm AB Living   1 1 1     1    SAB TAB Ectopic Multiple Live Births           1       Home Medications    Prior to Admission medications   Medication Sig Start Date End Date Taking? Authorizing Provider  amoxicillin-clavulanate (AUGMENTIN) 875-125 MG tablet Take 1 tablet by mouth every 12 (twelve) hours. 02/03/17   Bhavesh Vazquez, Waylan Boga, PA-C  cetirizine-pseudoephedrine (ZYRTEC-D) 5-120 MG tablet Take 1 tablet by mouth 2 (two) times daily. 02/03/17   Keilynn Marano, Waylan Boga, PA-C  Etonogestrel (IMPLANON [place]) Inject into the skin.    [provider]  fluconazole (DIFLUCAN) 150 MG tablet Take one tablet as needed for vaginal yeast infection. May repeat in 3 days if symptoms persist. 01/31/17   Molpus, Jonny Ruiz, MD  naproxen (NAPROSYN) 375 MG tablet Take 1 tablet (375 mg total) by mouth 2 (two) times daily as needed (for pain). 01/31/17   Molpus, John, MD    NEOMYCIN-POLYMYXIN-HYDROCORTISONE (CORTISPORIN) 1 % SOLN OTIC solution Place 3 drops into both ears every 6 (six) hours. 02/03/17   Emi Holes, PA-C    Family History Family History  Problem Relation Age of Onset  . Stroke Maternal Grandmother   . Diabetes Maternal Grandmother   . Diabetes Maternal Grandfather     Social History Social History  Substance Use Topics  . Smoking status: Former Smoker    Packs/day: 0.00    Quit date: 01/17/2017  . Smokeless tobacco: Never Used  . Alcohol use Yes     Comment: occasional      Allergies   Patient has no known allergies.   Review of Systems Review of Systems  Constitutional: Negative for fever.  HENT: Positive for congestion, ear pain and sore throat. Negative for ear discharge.   Respiratory: Negative for cough.      Physical Exam Updated Vital Signs BP (!) 143/103 (BP Location: Left Arm)   Pulse 95   Temp 98.9 F (37.2 C) (Oral)   Resp 18   Ht 5\' 6"  (1.676 m)   Wt 84.4 kg (186 lb)   LMP 01/24/2017   SpO2 99%   BMI 30.02 kg/m   Physical Exam  Constitutional: She appears well-developed and well-nourished. No distress.  HENT:  Head: Normocephalic and atraumatic.  Right Ear: No  tenderness. No mastoid tenderness. Tympanic membrane is injected and erythematous.  Left Ear: No tenderness. No mastoid tenderness. Tympanic membrane is injected and erythematous.  Mouth/Throat: Oropharynx is clear and moist. No oropharyngeal exudate.  Canals mildly erythematous  Eyes: Pupils are equal, round, and reactive to light. Conjunctivae are normal. Right eye exhibits no discharge. Left eye exhibits no discharge. No scleral icterus.  Neck: Normal range of motion. Neck supple. No thyromegaly present.  Cardiovascular: Normal rate, regular rhythm, normal heart sounds and intact distal pulses.  Exam reveals no gallop and no friction rub.   No murmur heard. Pulmonary/Chest: Effort normal and breath sounds normal. No stridor. No  respiratory distress. She has no wheezes. She has no rales.  Abdominal: Soft. Bowel sounds are normal. She exhibits no distension. There is no tenderness. There is no rebound and no guarding.  Musculoskeletal: She exhibits no edema.  Lymphadenopathy:    She has no cervical adenopathy.  Neurological: She is alert. Coordination normal.  Skin: Skin is warm and dry. No rash noted. She is not diaphoretic. No pallor.  Psychiatric: She has a normal mood and affect.  Nursing note and vitals reviewed.    ED Treatments / Results  Labs (all labs ordered are listed, but only abnormal results are displayed) Labs Reviewed - No data to display  EKG  EKG Interpretation None       Radiology No results found.  Procedures Procedures (including critical care time)  Medications Ordered in ED Medications - No data to display   Initial Impression / Assessment and Plan / ED Course  I have reviewed the triage vital signs and the nursing notes.  Pertinent labs & imaging results that were available during my care of the patient were reviewed by me and considered in my medical decision making (see chart for details).     Patient with worsening symptoms after treatment with penicillin 3 days ago. We'll broaden spectrum with Augmentin. We'll also treat with Cortisporin considering mild erythema and canals and patient using oils. We'll also treat with Zyrtec-D probable congestion as cause of fullness sensation. Return precautions discussed. Patient to follow-up with ENT if symptoms are not improving over the next 7 to patient understands and agrees with plan. Patient vitals stable throughout ED course discharge in satisfactory condition. I discussed patient case with Dr. Jacqulyn BathLong who guided the patient's management and agrees with plan.  Final Clinical Impressions(s) / ED Diagnoses   Final diagnoses:  Acute suppurative otitis media of both ears without spontaneous rupture of tympanic membranes, recurrence  not specified    New Prescriptions Discharge Medication List as of 02/03/2017  9:38 AM    START taking these medications   Details  amoxicillin-clavulanate (AUGMENTIN) 875-125 MG tablet Take 1 tablet by mouth every 12 (twelve) hours., Starting Mon 02/03/2017, Print    cetirizine-pseudoephedrine (ZYRTEC-D) 5-120 MG tablet Take 1 tablet by mouth 2 (two) times daily., Starting Mon 02/03/2017, Print    NEOMYCIN-POLYMYXIN-HYDROCORTISONE (CORTISPORIN) 1 % SOLN OTIC solution Place 3 drops into both ears every 6 (six) hours., Starting Mon 02/03/2017, [address]Print         Yoona Ishii, HebronAlexandra M, PA-C 02/03/17 1013    Maia PlanLong, Joshua G, MD 02/03/17 Jerene Bears1920

## 2017-02-06 ENCOUNTER — Emergency Department (HOSPITAL_BASED_OUTPATIENT_CLINIC_OR_DEPARTMENT_OTHER)
Admission: EM | Admit: 2017-02-06 | Discharge: 2017-02-06 | Disposition: A | Discharge status: Home / Self Care | Payer: Self-pay | Attending: Emergency Medicine | Admitting: Emergency Medicine

## 2017-02-06 ENCOUNTER — Encounter (HOSPITAL_BASED_OUTPATIENT_CLINIC_OR_DEPARTMENT_OTHER): Payer: Self-pay | Admitting: *Deleted

## 2017-02-06 DIAGNOSIS — H6503 Acute serous otitis media, bilateral: Secondary | ICD-10-CM | POA: Insufficient documentation

## 2017-02-06 DIAGNOSIS — Z87891 Personal history of nicotine dependence: Secondary | ICD-10-CM | POA: Insufficient documentation

## 2017-02-06 DIAGNOSIS — I10 Essential (primary) hypertension: Secondary | ICD-10-CM | POA: Insufficient documentation

## 2017-02-06 NOTE — Discharge Instructions (Signed)
Please read instructions below. Continue taking the amoxicillin/clavulanate every 12 hours until it is gone. Continue using the eardrops as previously prescribed. You can use saline nasal spray and Mucinex DM as needed for congestion. Be sure to drink plenty of water when taking this medication. You can take Tylenol or Advil as needed for pain. Schedule an appointment with the ENT specialist, as referred in your prior discharge paperwork, if symptoms persist. Return to the ER for new or concerning symptoms.

## 2017-02-06 NOTE — ED Triage Notes (Signed)
Cough, ear pain and cold symptoms. She was treated for an ear infection on 8/3.

## 2017-02-07 NOTE — ED Provider Notes (Signed)
[facility]   CSN: [id number] Arrival date & time: 02/06/17  2050     History   Chief Complaint Chief Complaint  Patient presents with  . URI    HPI Gloria Reilly is a 21 y.o. female w PMHx HTN, Presents with ongoing URI symptoms 1 week. Patient was seen twice for similar complaints on 01/31/2017 and 02/03/2017. On initial visit, she was diagnosed with otitis media and treated with amoxicillin. She reported 3 days later for persistent symptoms, antibiotic was changed to Augmentin and Zyrtec and Corticosporin otic drops were added to treatment. Patient reports today for ongoing symptoms as well as dry cough. She states she has only been taking her antibiotic once per day. On further discussion it seems she had calculated 12 hour period incorrectly and was therefore not taking the antibiotic as prescribed. She reports symptoms are persistent with bilateral ear pain, nasal congestion and a dry nonproductive intermittent cough. She denies fever or chills, sore throat, difficulty swallowing or breathing, chest pain, nausea, rash or any other symptoms today.  The history is provided by the patient.    Past Medical History:  Diagnosis Date  . Hypertension 02/28/2013  . Normal labor 02/14/2013  . Postpartum care following vaginal delivery (8/17) 02/14/2013    Patient Active Problem List   Diagnosis Date Noted  . Anemia 03/06/2013  . Hypertension 02/28/2013    History reviewed. No pertinent surgical history.  OB History    Gravida Para Term Preterm AB Living   1 1 1     1    SAB TAB Ectopic Multiple Live Births           1       Home Medications    Prior to Admission medications   Medication Sig Start Date End Date Taking? Authorizing Provider  amoxicillin-clavulanate (AUGMENTIN) 875-125 MG tablet Take 1 tablet by mouth every 12 (twelve) hours. 02/03/17   Law, Waylan Boga, PA-C  cetirizine-pseudoephedrine (ZYRTEC-D) 5-120 MG tablet Take 1 tablet by mouth 2  (two) times daily. 02/03/17   Law, Waylan Boga, PA-C  Etonogestrel (IMPLANON [place]) Inject into the skin.    [provider]  fluconazole (DIFLUCAN) 150 MG tablet Take one tablet as needed for vaginal yeast infection. May repeat in 3 days if symptoms persist. 01/31/17   Molpus, Jonny Ruiz, MD  naproxen (NAPROSYN) 375 MG tablet Take 1 tablet (375 mg total) by mouth 2 (two) times daily as needed (for pain). 01/31/17   Molpus, John, MD  NEOMYCIN-POLYMYXIN-HYDROCORTISONE (CORTISPORIN) 1 % SOLN OTIC solution Place 3 drops into both ears every 6 (six) hours. 02/03/17   Emi Holes, PA-C    Family History Family History  Problem Relation Age of Onset  . Stroke Maternal Grandmother   . Diabetes Maternal Grandmother   . Diabetes Maternal Grandfather     Social History Social History  Substance Use Topics  . Smoking status: Former Smoker    Packs/day: 0.00    Quit date: 01/17/2017  . Smokeless tobacco: Never Used  . Alcohol use Yes     Comment: occasional      Allergies   Patient has no known allergies.   Review of Systems Review of Systems  Constitutional: Negative for chills and fever.  HENT: Positive for congestion and ear pain. Negative for ear discharge, sore throat, trouble swallowing and voice change.   Eyes: Negative for visual disturbance.  Respiratory: Positive for cough. Negative for shortness of breath.   Cardiovascular: Negative for  chest pain.  Gastrointestinal: Negative for abdominal pain and nausea.  Genitourinary: Negative.   Allergic/Immunologic: Negative for immunocompromised state.     Physical Exam Updated Vital Signs BP (!) 144/102   Pulse 83   Temp 100 F (37.8 C) (Oral)   Resp 20   Ht 5\' 6"  (1.676 m)   Wt 84.4 kg (186 lb)   LMP 01/24/2017   SpO2 100%   BMI 30.02 kg/m   Physical Exam  Constitutional: She appears well-developed and well-nourished. No distress.  HENT:  Head: Normocephalic and atraumatic.  Right Ear: Hearing and external ear normal.  No mastoid tenderness. Tympanic membrane is bulging.  Left Ear: Hearing and external ear normal. No mastoid tenderness. Tympanic membrane is injected and bulging.  Mouth/Throat: Uvula is midline and mucous membranes are normal. No trismus in the jaw. No uvula swelling. Posterior oropharyngeal erythema present.  Left canal erythematous  Eyes: Conjunctivae are normal.  Neck: Normal range of motion. Neck supple.  Cardiovascular: Normal rate, regular rhythm, normal heart sounds and intact distal pulses.  Exam reveals no friction rub.   No murmur heard. Pulmonary/Chest: Effort normal and breath sounds normal. No stridor. No respiratory distress. She has no wheezes. She has no rhonchi. She has no rales.  Abdominal: Soft. Bowel sounds are normal. She exhibits no distension. There is no tenderness. There is no rebound and no guarding.  Musculoskeletal: Normal range of motion.  Lymphadenopathy:    She has no cervical adenopathy.  Psychiatric: She has a normal mood and affect. Her behavior is normal.  Nursing note and vitals reviewed.    ED Treatments / Results  Labs (all labs ordered are listed, but only abnormal results are displayed) Labs Reviewed - No data to display  EKG  EKG Interpretation None       Radiology No results found.  Procedures Procedures (including critical care time)  Medications Ordered in ED Medications - No data to display   Initial Impression / Assessment and Plan / ED Course  I have reviewed the triage vital signs and the nursing notes.  Pertinent labs & imaging results that were available during my care of the patient were reviewed by me and considered in my medical decision making (see chart for details).     Patient presenting with persistent URI symptoms. Patient not taking antibiotics as prescribed, and only on day 3. Lungs are clear to auscultation, tolerating secretions. Recommend pt continue taking antibiotics and instructed on proper use and  dosing. Discussed over-the-counter symptomatic management. Discussed strict return precautions. Patient is hemodynamically stable, nondistressed, nontoxic, safe for discharge home.  Patient discussed with and seen by Dr. Madilyn Hookees, who agrees with care plan.  Discussed results, findings, treatment and follow up. Patient advised of return precautions. Patient verbalized understanding and agreed with plan.  Final Clinical Impressions(s) / ED Diagnoses   Final diagnoses:  Bilateral acute serous otitis media, recurrence not specified    New Prescriptions Discharge Medication List as of 02/06/2017 11:43 PM       Russo, [place]azil[place]Jordan N, PA-C 02/07/17 0126    Tilden Fossaees, Elizabeth, MD 02/07/17 [phone]

## 2017-06-03 ENCOUNTER — Emergency Department (HOSPITAL_BASED_OUTPATIENT_CLINIC_OR_DEPARTMENT_OTHER)
Admission: EM | Admit: 2017-06-03 | Discharge: 2017-06-03 | Disposition: A | Discharge status: Home / Self Care | Payer: Self-pay | Attending: Emergency Medicine | Admitting: Emergency Medicine

## 2017-06-03 ENCOUNTER — Encounter (HOSPITAL_BASED_OUTPATIENT_CLINIC_OR_DEPARTMENT_OTHER): Payer: Self-pay | Admitting: *Deleted

## 2017-06-03 ENCOUNTER — Other Ambulatory Visit: Payer: Self-pay

## 2017-06-03 DIAGNOSIS — Z87891 Personal history of nicotine dependence: Secondary | ICD-10-CM | POA: Insufficient documentation

## 2017-06-03 DIAGNOSIS — Z79899 Other long term (current) drug therapy: Secondary | ICD-10-CM | POA: Insufficient documentation

## 2017-06-03 DIAGNOSIS — Y93B9 Activity, other involving muscle strengthening exercises: Secondary | ICD-10-CM | POA: Insufficient documentation

## 2017-06-03 DIAGNOSIS — M79601 Pain in right arm: Secondary | ICD-10-CM | POA: Insufficient documentation

## 2017-06-03 DIAGNOSIS — X501XXA Overexertion from prolonged static or awkward postures, initial encounter: Secondary | ICD-10-CM | POA: Insufficient documentation

## 2017-06-03 DIAGNOSIS — I10 Essential (primary) hypertension: Secondary | ICD-10-CM | POA: Insufficient documentation

## 2017-06-03 NOTE — ED Triage Notes (Signed)
Right upper arm pain since she was at dance practice last night and doing planks.

## 2017-06-03 NOTE — ED Provider Notes (Signed)
[facility] EMERGENCY DEPARTMENT Provider Note   CSN: [id number] Arrival date & time: 06/03/17  1259     History   Chief Complaint Chief Complaint  Patient presents with  . Arm Pain    HPI Gloria Reilly is a 21 y.o. female.  21 yO F with a chief complaint of right arm pain.  She is unable to localize this.  Points to her triceps as well as the elbow as areas of pain.  Worse when she extends the arm over the head.  Worse with palpation.  She has been doing planks and feels that her arm is swollen where it was touching the ground.  She denies any other trauma.  Denies break in the skin.   The history is provided by the patient.  Injury  This is a new problem. The current episode started yesterday. The problem occurs constantly. The problem has not changed since onset.Pertinent negatives include no chest pain, no headaches and no shortness of breath. The symptoms are aggravated by bending and twisting. Nothing relieves the symptoms. She has tried nothing for the symptoms. The treatment provided no relief.    Past Medical History:  Diagnosis Date  . Hypertension 02/28/2013  . Normal labor 02/14/2013  . Postpartum care following vaginal delivery (8/17) 02/14/2013    Patient Active Problem List   Diagnosis Date Noted  . Anemia 03/06/2013  . Hypertension 02/28/2013    History reviewed. No pertinent surgical history.  OB History    Gravida Para Term Preterm AB Living   1 1 1     1    SAB TAB Ectopic Multiple Live Births           1       Home Medications    Prior to Admission medications   Medication Sig Start Date End Date Taking? Authorizing Provider  amoxicillin-clavulanate (AUGMENTIN) 875-125 MG tablet Take 1 tablet by mouth every 12 (twelve) hours. 02/03/17   Law, Waylan BogaAlexandra M, PA-C  cetirizine-pseudoephedrine (ZYRTEC-D) 5-120 MG tablet Take 1 tablet by mouth 2 (two) times daily. 02/03/17   Law, Waylan BogaAlexandra M, PA-C  Etonogestrel (IMPLANON [place]) Inject into the skin.     [provider]  fluconazole (DIFLUCAN) 150 MG tablet Take one tablet as needed for vaginal yeast infection. May repeat in 3 days if symptoms persist. 01/31/17   Molpus, Jonny RuizJohn, MD  naproxen (NAPROSYN) 375 MG tablet Take 1 tablet (375 mg total) by mouth 2 (two) times daily as needed (for pain). 01/31/17   Molpus, John, MD  NEOMYCIN-POLYMYXIN-HYDROCORTISONE (CORTISPORIN) 1 % SOLN OTIC solution Place 3 drops into both ears every 6 (six) hours. 02/03/17   Emi HolesLaw, Alexandra M, PA-C    Family History Family History  Problem Relation Age of Onset  . Stroke Maternal Grandmother   . Diabetes Maternal Grandmother   . Diabetes Maternal Grandfather     Social History Social History   Tobacco Use  . Smoking status: Former Smoker    Packs/day: 0.00    Last attempt to quit: 01/17/2017    Years since quitting: 0.3  . Smokeless tobacco: Never Used  Substance Use Topics  . Alcohol use: Yes    Comment: occasional   . Drug use: No     Allergies   Patient has no known allergies.   Review of Systems Review of Systems  Constitutional: Negative for chills and fever.  HENT: Negative for congestion and rhinorrhea.   Eyes: Negative for redness and visual disturbance.  Respiratory: Negative for  shortness of breath and wheezing.   Cardiovascular: Negative for chest pain and palpitations.  Gastrointestinal: Negative for nausea and vomiting.  Genitourinary: Negative for dysuria and urgency.  Musculoskeletal: Positive for arthralgias and myalgias.  Skin: Negative for pallor and wound.  Neurological: Negative for dizziness and headaches.     Physical Exam Updated Vital Signs BP 126/87   Pulse 75   Temp 98 F (36.7 C) (Oral)   Resp 18   Ht 5\' 7"  (1.702 m)   Wt 79.4 kg (175 lb)   SpO2 100%   BMI 27.41 kg/m   Physical Exam  Constitutional: She is oriented to person, place, and time. She appears well-developed and well-nourished. No distress.  HENT:  Head: Normocephalic and atraumatic.    Eyes: EOM are normal. Pupils are equal, round, and reactive to light.  Neck: Normal range of motion. Neck supple.  Cardiovascular: Normal rate and regular rhythm. Exam reveals no gallop and no friction rub.  No murmur heard. Pulmonary/Chest: Effort normal. She has no wheezes. She has no rales.  Abdominal: Soft. She exhibits no distension. There is no tenderness.  Musculoskeletal: She exhibits no edema or tenderness.  No appreciable location of pain to the right upper extremity.  Pulse motor and sensation is intact.  Patient points to her dorsal elbow as the area of pain and swelling.  I do not appreciate any swelling.  Neurological: She is alert and oriented to person, place, and time.  Skin: Skin is warm and dry. She is not diaphoretic.  Psychiatric: She has a normal mood and affect. Her behavior is normal.  Nursing note and vitals reviewed.    ED Treatments / Results  Labs (all labs ordered are listed, but only abnormal results are displayed) Labs Reviewed - No data to display  EKG  EKG Interpretation None       Radiology No results found.  Procedures Procedures (including critical care time)  Medications Ordered in ED Medications - No data to display   Initial Impression / Assessment and Plan / ED Course  I have reviewed the triage vital signs and the nursing notes.  Pertinent labs & imaging results that were available during my care of the patient were reviewed by me and considered in my medical decision making (see chart for details).     21 yo F with a chief complaint of right arm pain.  She has no bony tenderness.  I am unable to palpate any area of discrete tenderness.  Suspect this is a muscular strain.  Discharge home.  2:34 PM:  I have discussed the diagnosis/risks/treatment options with the patient and family and believe the pt to be eligible for discharge home to follow-up with PCP. We also discussed returning to the ED immediately if new or worsening sx  occur. We discussed the sx which are most concerning (e.g., sudden worsening pain, fever, inability to tolerate by mouth) that necessitate immediate return. Medications administered to the patient during their visit and any new prescriptions provided to the patient are listed below.  Medications given during this visit Medications - No data to display   The patient appears reasonably screen and/or stabilized for discharge and I doubt any other medical condition or other [facility] requiring further screening, evaluation, or treatment in the ED at this time prior to discharge.    Final Clinical Impressions(s) / ED Diagnoses   Final diagnoses:  Right arm pain    ED Discharge Orders    None  Melene PlanFloyd, Kylin Dubs, DO 06/03/17 1434

## 2017-06-03 NOTE — Discharge Instructions (Signed)
Take 4 over the counter ibuprofen tablets 3 times a day or 2 over-the-counter naproxen tablets twice a day for pain. Also take tylenol 1000mg(2 extra strength) four times a day.    

## 2017-06-10 ENCOUNTER — Other Ambulatory Visit: Payer: Self-pay

## 2017-06-10 ENCOUNTER — Encounter (HOSPITAL_BASED_OUTPATIENT_CLINIC_OR_DEPARTMENT_OTHER): Payer: Self-pay

## 2017-06-10 ENCOUNTER — Emergency Department (HOSPITAL_BASED_OUTPATIENT_CLINIC_OR_DEPARTMENT_OTHER)
Admission: EM | Admit: 2017-06-10 | Discharge: 2017-06-10 | Disposition: A | Discharge status: Home / Self Care | Payer: Self-pay | Attending: Emergency Medicine | Admitting: Emergency Medicine

## 2017-06-10 ENCOUNTER — Encounter (HOSPITAL_COMMUNITY): Payer: Self-pay

## 2017-06-10 ENCOUNTER — Inpatient Hospital Stay (HOSPITAL_COMMUNITY)
Admission: AD | Admit: 2017-06-10 | Discharge: 2017-06-10 | Disposition: A | Discharge status: Home / Self Care | Payer: Self-pay | Source: Ambulatory Visit | Attending: Obstetrics & Gynecology | Admitting: Obstetrics & Gynecology

## 2017-06-10 DIAGNOSIS — Z5321 Procedure and treatment not carried out due to patient leaving prior to being seen by health care provider: Secondary | ICD-10-CM | POA: Insufficient documentation

## 2017-06-10 DIAGNOSIS — M79601 Pain in right arm: Secondary | ICD-10-CM

## 2017-06-10 DIAGNOSIS — Z975 Presence of (intrauterine) contraceptive device: Secondary | ICD-10-CM

## 2017-06-10 MED ORDER — MELOXICAM 15 MG PO TABS: 15.0000 mg | ORAL_TABLET | Freq: Every day | ORAL | 0 refills | Status: AC

## 2017-06-10 NOTE — ED Triage Notes (Signed)
C/o right UE pain since 12/4-started after doing planks-was seen here same day-states she is still having pain-NAD-steady gait

## 2017-06-10 NOTE — Discharge Instructions (Signed)
Musculoskeletal Pain  Musculoskeletal pain is muscle and bone aches and pains. This pain can occur in any part of the body.  Follow these instructions at home:  · Only take medicines for pain, discomfort, or fever as told by your health care provider.  · You may continue all activities unless the activities cause more pain. When the pain lessens, slowly resume normal activities. Gradually increase the intensity and duration of the activities or exercise.  · During periods of severe pain, bed rest may be helpful. Lie or sit in any position that is comfortable, but get out of bed and walk around at least every several hours.  · If directed, put ice on the injured area.  ? Put ice in a plastic bag.  ? Place a towel between your skin and the bag.  ? Leave the ice on for 20 minutes, 2-3 times a day.  Contact a health care provider if:  · Your pain is getting worse.  · Your pain is not relieved with medicines.  · You lose function in the area of the pain if the pain is in your arms, legs, or neck.  This information is not intended to replace advice given to you by your health care provider. Make sure you discuss any questions you have with your health care provider.  Document Released: 06/17/2005 Document Revised: 11/28/2015 Document Reviewed: 02/19/2013  Elsevier Interactive Patient Education © 2017 [employer]

## 2017-06-10 NOTE — MAU Provider Note (Signed)
History  CSN: [id number] Arrival date and time: 06/10/17 1916  First Provider Initiated Contact with Patient 06/10/17 2124      Chief Complaint  Patient presents with  . Arm Pain    HPI: Gloria Reilly is a 21 y.o. G1P1001 who presents to maternity admissions reporting right arm pain. She reports pain started about a week ago. Reports being worried because this is the arm she has a Nexplanon (placed about 4 years ago). Pain is on the back of the arm, radiating to her shoulder. Reports this arm felt swollen initially, but this since has improved. Denies any trauma or any extraneous activity. Pain worse with certain movements, including moving arm overhead. Denies any other concerns, including no fever, chills, rashes, chest pain, SOB, dizziness or lightheadedness. Denies personal or family hx of DVT. Denies having any recent procedures done.    OB History  Gravida Para Term Preterm AB Living  1 1 1     1   SAB TAB Ectopic Multiple Live Births          1    # Outcome Date GA Lbr Len/2nd Weight Sex Delivery Anes PTL Lv  1 Term 02/14/13 1934w2d 13:08 / 01:44 7 lb 1.9 oz (3.229 kg) M Vag-Spont EPI  LIV     Birth Comments: WNL     Past Medical History:  Diagnosis Date  . Hypertension 02/28/2013  . Normal labor 02/14/2013  . Postpartum care following vaginal delivery (8/17) 02/14/2013   History reviewed. No pertinent surgical history. Social History   Socioeconomic History  . Marital status: Single    Spouse name: Not on file  . Number of children: Not on file  . Years of education: Not on file  . Highest education level: Not on file  Social Needs  . Financial resource strain: Not on file  . Food insecurity - worry: Not on file  . Food insecurity - inability: Not on file  . Transportation needs - medical: Not on file  . Transportation needs - non-medical: Not on file  Occupational History  . Not on file  Tobacco Use  . Smoking status: Never Smoker  . Smokeless tobacco: Never Used   Substance and Sexual Activity  . Alcohol use: Yes    Comment: occasional   . Drug use: Yes    Types: Marijuana    Comment: daily  . Sexual activity: Yes    Birth control/protection: Implant  Other Topics Concern  . Not on file  Social History Narrative  . Not on file   No Known Allergies  No medications prior to admission.    I have reviewed patient's Past Medical Hx, Surgical Hx, Family Hx, Social Hx, medications and allergies.   Review of Systems: Negative except for what is mentioned in HPI.  Physical Exam   Blood pressure 139/87, pulse 73, temperature 98.2 F (36.8 C), temperature source Oral, resp. rate 18, height 5\' 6"  (1.676 m), weight 191 lb (86.6 kg), last menstrual period 06/02/2017, SpO2 100 %.  Constitutional: Well-developed, well-nourished female in no acute distress.  HENT: [place]/AT, normal oropharynx mucosa. MMM Eyes: normal conjunctivae, no scleral icterus Cardiovascular: normal rate, regular rhythm. Bilateral radial pulses 2+ Respiratory: normal effort, lungs CTAB.  MSK: RUE - full passive and active ROM. No swelling noted. Nexplanon easily palpable in the right position. No TTP throughout. Shoulder with full mobility, negative impingement sign.  Neurologic: Alert and oriented x 4. Psych: Normal mood and affect Skin: warm and dry; no rash noted  on RUE.     MAU Course/MDM:   Nursing notes and VS reviewed. Patient seen and examined, as noted above.   Exam reassuring. No swelling noted of RUE and good pulses. No concerns for DVT of upper extremity. Pain most c/w msk pain. Discussed placing on scheduled NSAID for 7 days.  Nexplanon in place expired. Discussed need to have this removed/replaced and back up contraception. [facility] and [facility] for follow up options.  Assessment and Plan  Assessment: 1. Musculoskeletal arm pain, right   2. Nexplanon in place     Plan: --Rx: Meloxicam 15 mg po daily x 7 days --Discharge  home in stable condition --Discussed return precautions, including swelling, severe uncontrolled pain, or any new concerning symptom.  --Dicussed follow up with a PCP if persistent or re-occurring symptoms.  Degele, Kandra NicolasJulie P, MD 06/10/2017 10:01 PM

## 2017-06-10 NOTE — MAU Note (Signed)
Pt here with arm pain, swelling, numbness at and above birth control site in right arm. Going on for about a week.

## 2017-06-10 NOTE — ED Notes (Addendum)
Pt is in [facility] ED system [facility]unable to place pt OTF

## 2017-08-06 ENCOUNTER — Other Ambulatory Visit: Payer: Self-pay

## 2017-08-06 ENCOUNTER — Emergency Department (HOSPITAL_BASED_OUTPATIENT_CLINIC_OR_DEPARTMENT_OTHER)
Admission: EM | Admit: 2017-08-06 | Discharge: 2017-08-06 | Disposition: A | Discharge status: Home / Self Care | Payer: Self-pay | Attending: Emergency Medicine | Admitting: Emergency Medicine

## 2017-08-06 ENCOUNTER — Emergency Department (HOSPITAL_BASED_OUTPATIENT_CLINIC_OR_DEPARTMENT_OTHER): Payer: Self-pay

## 2017-08-06 ENCOUNTER — Encounter (HOSPITAL_BASED_OUTPATIENT_CLINIC_OR_DEPARTMENT_OTHER): Payer: Self-pay | Admitting: Emergency Medicine

## 2017-08-06 DIAGNOSIS — M79671 Pain in right foot: Secondary | ICD-10-CM

## 2017-08-06 DIAGNOSIS — I10 Essential (primary) hypertension: Secondary | ICD-10-CM | POA: Insufficient documentation

## 2017-08-06 NOTE — ED Triage Notes (Signed)
Pt reports pain to R foot from ball of foot into big toe since Sunday. Denies injury.

## 2017-08-06 NOTE — Discharge Instructions (Signed)
Ms. Gloria Reilly,  There was no fracture of your toe. Tylenol and ibuprofen can help with inflammation that is causing your pain. Ice can also help. Please wear supportive shoes. If pain is not improving over the next week, you can call Sports Medicine at [phone] for Dr. Pearletha ForgeHudnall for further evaluation.

## 2017-08-06 NOTE — ED Provider Notes (Signed)
[facility] EMERGENCY DEPARTMENT Provider Note   CSN: [id number] Arrival date & time: 08/06/17  [id number]     History   Chief Complaint Chief Complaint  Patient presents with  . Foot Pain    HPI Gloria Reilly is a 22 y.o. female who presents for right toe pain since Sunday.  HPI  Patient having throbbing pain x 2 days that makes it difficult to press pedals while driving. Patient reports hitting her right big toe against a wooden chair about 1 month ago. It did not have significant swelling at that time but hurt with pressure applied; it got better on its own after about 2 weeks. No new injury or change in activity, though patient reports dancing at church on Sunday. 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 She did sprain her right ankle several years ago.   Past Medical History:  Diagnosis Date  . Hypertension 02/28/2013  . Normal labor 02/14/2013  . Postpartum care following vaginal delivery (8/17) 02/14/2013    Patient Active Problem List   Diagnosis Date Noted  . Anemia 03/06/2013  . Hypertension 02/28/2013    History reviewed. No pertinent surgical history.  OB History    Gravida Para Term Preterm AB Living   1 1 1     1    SAB TAB Ectopic Multiple Live Births           1       Home Medications    Prior to Admission medications   Not on File    Family History Family History  Problem Relation Age of Onset  . Stroke Maternal Grandmother   . Diabetes Maternal Grandmother   . Diabetes Maternal Grandfather     Social History Social History   Tobacco Use  . Smoking status: Never Smoker  . Smokeless tobacco: Never Used  Substance Use Topics  . Alcohol use: Yes    Comment: occasional   . Drug use: Yes    Types: Marijuana    Comment: daily     Allergies   Patient has no known allergies.   Review of Systems Review of Systems  Constitutional: Negative for fatigue and fever.  HENT: Negative for  rhinorrhea.   Respiratory: Negative for cough.   Gastrointestinal: Negative for diarrhea, nausea and vomiting.  Musculoskeletal: Negative for back pain and myalgias.  Skin: Negative for rash.  Neurological: Negative for weakness and numbness.     Physical Exam Updated Vital Signs BP (!) 150/99 (BP Location: Right Arm)   Pulse 73   Temp 98.6 F (37 C) (Oral)   Resp 15   Ht 5\' 6"  (1.676 m)   Wt 79.4 kg (175 lb)   SpO2 100%   BMI 28.25 kg/m   Physical Exam  Constitutional: She is oriented to person, place, and time. She appears well-developed and well-nourished. No distress.  HENT:  Head: Normocephalic and atraumatic.  Eyes: EOM are normal. Pupils are equal, round, and reactive to light.  Neck: Normal range of motion.  Pulmonary/Chest: Effort normal.  Musculoskeletal: She exhibits tenderness (Over proximal phalanx of R hallux).  Neurological: She is alert and oriented to person, place, and time. No sensory deficit.  Skin: Skin is warm and dry.  Minimal swelling over proximal phalanx without bruising or surrounding erythema.  Vitals reviewed.    ED Treatments / Results  Labs (all labs ordered are listed, but only abnormal results are displayed) Labs Reviewed -  No data to display  EKG  EKG Interpretation None       Radiology Dg Foot Complete Right  Result Date: 08/06/2017 CLINICAL DATA:  Pain at the right first MTP joint. EXAM: RIGHT FOOT COMPLETE - 3+ VIEW COMPARISON:  None. FINDINGS: There is no evidence of fracture or dislocation. Tiny dorsal spur on the navicular. Otherwise negative. Soft tissues are unremarkable. IMPRESSION: No significant abnormality. Specifically, the right first MTP joint appears normal. Electronically Signed   By: Francene Boyers M.D.   On: 08/06/2017 08:22    Procedures Procedures (including critical care time)  Medications Ordered in ED Medications - No data to display   Initial Impression / Assessment and Plan / ED Course  I have  reviewed the triage vital signs and the nursing notes.  Pertinent labs & imaging results that were available during my care of the patient were reviewed by me and considered in my medical decision making (see chart for details).  No fracture or joint abnormality on R foot Xray.   Final Clinical Impressions(s) / ED Diagnoses   Final diagnoses:  Foot pain, right   Suspect soft tissue injury or bone bruise. Advised patient to wear supportive shoes and let pain be her guide for activity. To use tylenol or ibuprofen for pain. Counseled to elevate foot as able.   ED Discharge Orders        Ordered    Ambulatory referral to Sports Medicine    Comments:  Referral to Sports Medicine for R toe pain.  Verify address and phone number.  Sports medicine clinic will contact the family to schedule appointment.   08/06/17 4098     Dani Gobble, MD [facility], PGY-3    Casey Burkitt, MD 08/06/17 1710    Little, Ambrose Finland, MD 08/08/17 [phone]

## 2017-10-21 ENCOUNTER — Other Ambulatory Visit: Payer: Self-pay

## 2017-10-21 ENCOUNTER — Encounter (HOSPITAL_BASED_OUTPATIENT_CLINIC_OR_DEPARTMENT_OTHER): Payer: Self-pay | Admitting: *Deleted

## 2017-10-21 DIAGNOSIS — I10 Essential (primary) hypertension: Secondary | ICD-10-CM | POA: Insufficient documentation

## 2017-10-21 DIAGNOSIS — J029 Acute pharyngitis, unspecified: Secondary | ICD-10-CM | POA: Insufficient documentation

## 2017-10-21 NOTE — ED Triage Notes (Signed)
Pt reports left sided facial pain radiating into her left ear and left dental.  Reports left sided throat pain.

## 2017-10-22 ENCOUNTER — Emergency Department (HOSPITAL_BASED_OUTPATIENT_CLINIC_OR_DEPARTMENT_OTHER)
Admission: EM | Admit: 2017-10-22 | Discharge: 2017-10-22 | Disposition: A | Discharge status: Home / Self Care | Payer: Self-pay | Attending: Emergency Medicine | Admitting: Emergency Medicine

## 2017-10-22 DIAGNOSIS — J029 Acute pharyngitis, unspecified: Secondary | ICD-10-CM

## 2017-10-22 LAB — RAPID STREP SCREEN (MED CTR MEBANE ONLY): Streptococcus, Group A Screen (Direct): NEGATIVE

## 2017-10-22 NOTE — ED Provider Notes (Signed)
[facility] Provider Note: Lowella DellJ. Lane Nyeemah Jennette, MD, FACEP  CSN: [id number] MRN: [id number] ARRIVAL: 10/21/17 at 2324 ROOM: MH04/MH04   CHIEF COMPLAINT  Sore Throat   HISTORY OF PRESENT ILLNESS  10/22/17 2:07 AM Gloria DukeJhakya Harth is a 22 y.o. female of a 3-day history of sore throat, worse with swallowing.  She rates her pain is a 4 out of 10.  It radiates to her ears bilaterally, greater on the left side.  She has had a fever to 100.4 as well as chills.  She has had transient relief with Tylenol.  She has had an occasional productive cough but no other cold symptoms.   Past Medical History:  Diagnosis Date  . Hypertension 02/28/2013  . Normal labor 02/14/2013  . Postpartum care following vaginal delivery (8/17) 02/14/2013    History reviewed. No pertinent surgical history.  Family History  Problem Relation Age of Onset  . Stroke Maternal Grandmother   . Diabetes Maternal Grandmother   . Diabetes Maternal Grandfather     Social History   Tobacco Use  . Smoking status: Never Smoker  . Smokeless tobacco: Never Used  Substance Use Topics  . Alcohol use: Yes    Comment: occasional   . Drug use: Yes    Types: Marijuana    Comment: daily    Prior to Admission medications   Not on File    Allergies Patient has no known allergies.   REVIEW OF SYSTEMS  Negative except as noted here or in the History of Present Illness.   PHYSICAL EXAMINATION  Initial Vital Signs Blood pressure (!) 157/98, pulse 75, temperature 98.7 F (37.1 C), temperature source Oral, resp. rate 18, height 5\' 7"  (1.702 m), weight 81.6 kg (180 lb), SpO2 99 %.  Examination General: Well-developed, well-nourished female in no acute distress; appearance consistent with age of record HENT: normocephalic; atraumatic; TMs normal; no pharyngeal erythema or exudate; no left maxillary tenderness Eyes: pupils equal, round and reactive to light; extraocular muscles intact Neck: supple Heart: regular  rate and rhythm Lungs: clear to auscultation bilaterally Abdomen: soft; nondistended; nontender; bowel sounds present Extremities: No deformity; full range of motion; pulses normal Neurologic: Awake, alert and oriented; motor function intact in all extremities and symmetric; no facial droop Skin: Warm and dry Psychiatric: Normal mood and affect   RESULTS  Summary of this visit's results, reviewed by myself:   EKG Interpretation  Date/Time:    Ventricular Rate:    PR Interval:    QRS Duration:   QT Interval:    QTC Calculation:   R Axis:     Text Interpretation:        Laboratory Studies: Results for orders placed or performed during the hospital encounter of 10/22/17 (from the past 24 hour(s))  Rapid Strep Screen ([facility])     Status: None   Collection Time: 10/22/17  2:12 AM  Result Value Ref Range   Streptococcus, Group A Screen (Direct) NEGATIVE NEGATIVE   Imaging Studies: No results found.  ED COURSE and MDM  Nursing notes and initial vitals signs, including pulse oximetry, reviewed.  Vitals:   10/21/17 2333 10/21/17 2334  BP: (!) 157/98   Pulse: 75   Resp: 18   Temp: 98.7 F (37.1 C)   TempSrc: Oral   SpO2: 99%   Weight:  81.6 kg (180 lb)  Height:  5\' 7"  (1.702 m)   Symptoms consistent with a viral pharyngitis.  No evidence of dental etiology on examination.  PROCEDURES    ED DIAGNOSES     ICD-10-CM   1. Viral pharyngitis J02.9        Wannetta Langland, MD 10/22/17 [id number]

## 2017-10-24 LAB — CULTURE, GROUP A STREP (THRC)

## 2017-12-01 ENCOUNTER — Encounter (HOSPITAL_BASED_OUTPATIENT_CLINIC_OR_DEPARTMENT_OTHER): Payer: Self-pay | Admitting: Emergency Medicine

## 2017-12-01 ENCOUNTER — Other Ambulatory Visit: Payer: Self-pay

## 2017-12-01 ENCOUNTER — Emergency Department (HOSPITAL_BASED_OUTPATIENT_CLINIC_OR_DEPARTMENT_OTHER)
Admission: EM | Admit: 2017-12-01 | Discharge: 2017-12-01 | Disposition: A | Discharge status: Home / Self Care | Payer: Self-pay | Attending: Emergency Medicine | Admitting: Emergency Medicine

## 2017-12-01 DIAGNOSIS — K0889 Other specified disorders of teeth and supporting structures: Secondary | ICD-10-CM | POA: Insufficient documentation

## 2017-12-01 DIAGNOSIS — Z5321 Procedure and treatment not carried out due to patient leaving prior to being seen by health care provider: Secondary | ICD-10-CM | POA: Insufficient documentation

## 2017-12-01 NOTE — ED Triage Notes (Signed)
paient states that she is having pain to the left side of her mouth since she had a broken tooth

## 2017-12-04 NOTE — ED Notes (Signed)
Follow up call made  No answer  12/04/17  1301  s Keevan Wolz rn

## 2017-12-09 ENCOUNTER — Emergency Department (HOSPITAL_BASED_OUTPATIENT_CLINIC_OR_DEPARTMENT_OTHER)
Admission: EM | Admit: 2017-12-09 | Discharge: 2017-12-09 | Disposition: A | Discharge status: Home / Self Care | Payer: Self-pay | Attending: Emergency Medicine | Admitting: Emergency Medicine

## 2017-12-09 ENCOUNTER — Other Ambulatory Visit: Payer: Self-pay

## 2017-12-09 ENCOUNTER — Encounter (HOSPITAL_BASED_OUTPATIENT_CLINIC_OR_DEPARTMENT_OTHER): Payer: Self-pay | Admitting: Emergency Medicine

## 2017-12-09 DIAGNOSIS — K0889 Other specified disorders of teeth and supporting structures: Secondary | ICD-10-CM | POA: Insufficient documentation

## 2017-12-09 DIAGNOSIS — F1210 Cannabis abuse, uncomplicated: Secondary | ICD-10-CM | POA: Insufficient documentation

## 2017-12-09 DIAGNOSIS — I10 Essential (primary) hypertension: Secondary | ICD-10-CM | POA: Insufficient documentation

## 2017-12-09 DIAGNOSIS — H9202 Otalgia, left ear: Secondary | ICD-10-CM | POA: Insufficient documentation

## 2017-12-09 MED ORDER — PENICILLIN V POTASSIUM 500 MG PO TABS: 500.0000 mg | ORAL_TABLET | Freq: Four times a day (QID) | ORAL | 0 refills | Status: AC

## 2017-12-09 MED ORDER — NAPROXEN 375 MG PO TABS: 375.0000 mg | ORAL_TABLET | Freq: Two times a day (BID) | ORAL | 0 refills | Status: DC

## 2017-12-09 MED ORDER — NAPROXEN 250 MG PO TABS
500.0000 mg | ORAL_TABLET | Freq: Once | ORAL | Status: AC
  Administered 2017-12-09: 500 mg via ORAL
  Filled 2017-12-09: qty 2

## 2017-12-09 NOTE — Discharge Instructions (Signed)
Call one of the dentists offices provided to schedule an appointment for re-evaluation and further management within the next 48 hours.   I have prescribed you Penicillin VK which is an antibiotic to treat the infection and Naproxen which is an anti-inflammatory medicine to treat the pain.   Please take all of your antibiotics until finished. You may develop abdominal discomfort or diarrhea from the antibiotic.  You may help offset this with probiotics which you can buy at the store (ask your pharmacist if unable to find) or get probiotics in the form of eating yogurt. Do not eat or take the probiotics until 2 hours after your antibiotic. If you are unable to tolerate these side effects follow-up with your primary care provider or return to the emergency department.   If you begin to experience any blistering, rashes, swelling, or difficulty breathing seek medical care for evaluation of potentially more serious side effects.   Be sure to eat something when taking the Naproxen as it can cause stomach upset and at worst stomach bleeding. Do not take additional non steroidal anti-inflammatory medicines such as Ibuprofen, Aleve, or Advil while taking Naproxen. You may supplement with Tylenol.   Please be aware that this medications may interact with other medications you are taking, please be sure to discuss your medication list with your pharmacist. If you are taking birth control the antibiotic will deactivate your birth control for 2 weeks.   If you start to experience and new or worsening symptoms return to the emergency department. If you start to experience fever, inability to move your neck  or open your mouth, change in your voice, worsening pain/swelling, or any other concerns come back to the emergency department immediately.

## 2017-12-09 NOTE — ED Triage Notes (Signed)
Pt c/o LT dental and ear pain intermittently x 1 wk

## 2017-12-09 NOTE — ED Provider Notes (Signed)
[facility] EMERGENCY DEPARTMENT Provider Note   CSN: [id number] Arrival date & time: 12/09/17  0930     History   Chief Complaint Chief Complaint  Patient presents with  . Dental Pain    HPI Gloria Reilly is a 22 y.o. female with a hx of HTN and anemia who presents to the ED with complaints of dental pain that started about 1.5 weeks ago. Patient states that she had chipped her most posterior molar about 2 weeks ago when eating. She states that shortly after this she started having intermittent pain to the L upper gumline. She states this radiates to the L ear. Pain is intermittent, can be fairly persistent at times. Rates pain a 10/10 in severity, it has been improved with tylenol somewhat. No other specific alleviating/aggravating factors. She feels the L side of her face is swollen at times. Denies fever, chills, change in voice, drooling, or difficulty swallowing.   HPI  Past Medical History:  Diagnosis Date  . Hypertension 02/28/2013  . Normal labor 02/14/2013  . Postpartum care following vaginal delivery (8/17) 02/14/2013    Patient Active Problem List   Diagnosis Date Noted  . Anemia 03/06/2013  . Hypertension 02/28/2013    History reviewed. No pertinent surgical history.   OB History    Gravida  1   Para  1   Term  1   Preterm      AB      Living  1     SAB      TAB      Ectopic      Multiple      Live Births  1            Home Medications    Prior to Admission medications   Not on File    Family History Family History  Problem Relation Age of Onset  . Stroke Maternal Grandmother   . Diabetes Maternal Grandmother   . Diabetes Maternal Grandfather     Social History Social History   Tobacco Use  . Smoking status: Never Smoker  . Smokeless tobacco: Never Used  Substance Use Topics  . Alcohol use: Yes    Comment: occasional   . Drug use: Yes    Types: Marijuana    Comment: daily     Allergies   Patient has no  known allergies.   Review of Systems Review of Systems  Constitutional: Negative for chills and fever.  HENT: Positive for dental problem (L upper), ear pain (L) and facial swelling (L). Negative for drooling, trouble swallowing and voice change.   Respiratory: Negative for shortness of breath.     Physical Exam Updated Vital Signs BP (!) 145/106 (BP Location: Left Arm)   Pulse 77   Temp 98.5 F (36.9 C) (Oral)   Resp 16   Ht 5\' 7"  (1.702 m)   Wt 83.9 kg (185 lb)   BMI 28.98 kg/m   Physical Exam  Constitutional: She appears well-developed and well-nourished.  Non-toxic appearance. No distress.  HENT:  Head: Normocephalic and atraumatic.  Right Ear: No drainage, swelling or tenderness. No mastoid tenderness. Tympanic membrane is not perforated, not erythematous, not retracted and not bulging.  Left Ear: No drainage, swelling or tenderness. No mastoid tenderness. Tympanic membrane is not perforated, not erythematous, not retracted and not bulging.  Nose: Nose normal.  Mouth/Throat: Uvula is midline. No uvula swelling. No oropharyngeal exudate, posterior oropharyngeal erythema or tonsillar abscesses.    Patient has  multiple fillings in place. Patient tolerating her own secretions without difficulty. No trismus. No drooling. No hot potato voice. Submandibular compartment is soft. No appreciable facial swelling. No overlying erythema/induration/fluctuance to facial skin.   Eyes: Pupils are equal, round, and reactive to light. Conjunctivae are normal. Right eye exhibits no discharge. Left eye exhibits no discharge.  Neck: Normal range of motion and full passive range of motion without pain. Neck supple. No neck rigidity.  Cardiovascular: Normal rate and regular rhythm.  Pulmonary/Chest: Effort normal and breath sounds normal. No stridor. No respiratory distress. She has no wheezes.  Lymphadenopathy:    She has no cervical adenopathy.  Neurological: She is alert.  Psychiatric: She  has a normal mood and affect. Her behavior is normal. Thought content normal.  Nursing note and vitals reviewed.   ED Treatments / Results  Labs (all labs ordered are listed, but only abnormal results are displayed) Labs Reviewed - No data to display  EKG None  Radiology No results found.  Procedures Procedures (including critical care time)  Medications Ordered in ED Medications - No data to display   Initial Impression / Assessment and Plan / ED Course  I have reviewed the triage vital signs and the nursing notes.  Pertinent labs & imaging results that were available during my care of the patient were reviewed by me and considered in my medical decision making (see chart for details).    Patient presents with dental pain. Patient is nontoxic appearing, vitals WNL other than elevated BP, doubt HTN emergency, I discussed need for recheck by PCP with the patient.  No gross abscess.  Exam unconcerning for Ludwig's angina or spread of infection.  Will treat with Penicillin VK and Naproxen.  Urged patient to follow-up with dentist, dental resources were provided.  Discussed treatment plan and need for follow up as well as return precautions. Provided opportunity for questions, patient confirmed understanding and is agreeable to plan.   Final Clinical Impressions(s) / ED Diagnoses   Final diagnoses:  Pain, dental    ED Discharge Orders        Ordered    naproxen (NAPROSYN) 375 MG tablet  2 times daily     12/09/17 1022    penicillin v potassium (VEETID) 500 MG tablet  4 times daily     12/09/17 [address]1022       Danyella Mcginty, Winslow WestSamantha R, PA-C 12/09/17 1044    Pricilla LovelessGoldston, Scott, MD 12/09/17 1521

## 2018-02-16 ENCOUNTER — Encounter (HOSPITAL_BASED_OUTPATIENT_CLINIC_OR_DEPARTMENT_OTHER): Payer: Self-pay | Admitting: Emergency Medicine

## 2018-02-16 ENCOUNTER — Other Ambulatory Visit: Payer: Self-pay

## 2018-02-16 ENCOUNTER — Emergency Department (HOSPITAL_BASED_OUTPATIENT_CLINIC_OR_DEPARTMENT_OTHER)
Admission: EM | Admit: 2018-02-16 | Discharge: 2018-02-16 | Disposition: A | Discharge status: Home / Self Care | Payer: Self-pay | Attending: Emergency Medicine | Admitting: Emergency Medicine

## 2018-02-16 DIAGNOSIS — J020 Streptococcal pharyngitis: Secondary | ICD-10-CM | POA: Insufficient documentation

## 2018-02-16 DIAGNOSIS — I10 Essential (primary) hypertension: Secondary | ICD-10-CM | POA: Insufficient documentation

## 2018-02-16 DIAGNOSIS — Z79899 Other long term (current) drug therapy: Secondary | ICD-10-CM | POA: Insufficient documentation

## 2018-02-16 LAB — GROUP A STREP BY PCR: GROUP A STREP BY PCR: DETECTED — AB

## 2018-02-16 MED ORDER — IBUPROFEN 400 MG PO TABS
400.0000 mg | ORAL_TABLET | Freq: Once | ORAL | Status: AC
  Administered 2018-02-16: 400 mg via ORAL

## 2018-02-16 MED ORDER — PENICILLIN G BENZATHINE 1200000 UNIT/2ML IM SUSP
1.2000 10*6.[IU] | Freq: Once | INTRAMUSCULAR | Status: AC
  Administered 2018-02-16: 1.2 10*6.[IU] via INTRAMUSCULAR
  Filled 2018-02-16: qty 2

## 2018-02-16 MED ORDER — ACETAMINOPHEN 325 MG PO TABS
650.0000 mg | ORAL_TABLET | Freq: Once | ORAL | Status: AC
  Administered 2018-02-16: 650 mg via ORAL
  Filled 2018-02-16: qty 2

## 2018-02-16 MED ORDER — IBUPROFEN 400 MG PO TABS
ORAL_TABLET | ORAL | Status: AC
  Administered 2018-02-16: 400 mg via ORAL
  Filled 2018-02-16: qty 1

## 2018-02-16 NOTE — ED Notes (Signed)
ED Provider at bedside. 

## 2018-02-16 NOTE — ED Provider Notes (Signed)
[facility] EMERGENCY DEPARTMENT Provider Note   CSN: [id number] Arrival date & time: 02/16/18  0901     History   Chief Complaint Chief Complaint  Patient presents with  . Sore Throat  . Fever    HPI Gloria Reilly is a 22 y.o. female.  Patient is a 22 year old female who presents with sore throat and fever.  She has a 2-day history of sore throat with fever starting yesterday.  She says it hurts to swallow but has no difficulty controlling her secretions.  She has some pain along her lymph nodes in her anterior neck area.  She has no rash.  No vomiting.  She had a little bit of nausea.  She also has myalgias.  She is been taking Tylenol with some improvement in symptoms.     Past Medical History:  Diagnosis Date  . Hypertension 02/28/2013  . Normal labor 02/14/2013  . Postpartum care following vaginal delivery (8/17) 02/14/2013    Patient Active Problem List   Diagnosis Date Noted  . Anemia 03/06/2013  . Hypertension 02/28/2013    History reviewed. No pertinent surgical history.   OB History    Gravida  1   Para  1   Term  1   Preterm      AB      Living  1     SAB      TAB      Ectopic      Multiple      Live Births  1            Home Medications    Prior to Admission medications   Medication Sig Start Date End Date Taking? Authorizing Provider  naproxen (NAPROSYN) 375 MG tablet Take 1 tablet (375 mg total) by mouth 2 (two) times daily. 12/09/17   Petrucelli, Pleas KochSamantha R, PA-C    Family History Family History  Problem Relation Age of Onset  . Stroke Maternal Grandmother   . Diabetes Maternal Grandmother   . Diabetes Maternal Grandfather     Social History Social History   Tobacco Use  . Smoking status: Never Smoker  . Smokeless tobacco: Never Used  Substance Use Topics  . Alcohol use: Yes    Comment: occasional   . Drug use: Yes    Types: Marijuana    Comment: daily     Allergies   Patient has no known  allergies.   Review of Systems Review of Systems  Constitutional: Positive for fatigue and fever. Negative for chills and diaphoresis.  HENT: Positive for sore throat. Negative for congestion, rhinorrhea and sneezing.   Eyes: Negative.   Respiratory: Negative for cough, chest tightness and shortness of breath.   Cardiovascular: Negative for chest pain and leg swelling.  Gastrointestinal: Positive for nausea. Negative for abdominal pain, blood in stool, diarrhea and vomiting.  Genitourinary: Negative for difficulty urinating, flank pain, frequency and hematuria.  Musculoskeletal: Negative for arthralgias and back pain.  Skin: Negative for rash.  Neurological: Positive for light-headedness and headaches. Negative for dizziness, speech difficulty, weakness and numbness.     Physical Exam Updated Vital Signs BP 133/81 (BP Location: Right Arm)   Pulse (!) 103   Temp (!) 102.3 F (39.1 C) (Oral)   Resp 16   Ht 5\' 7"  (1.702 m)   Wt 72.1 kg   SpO2 99%   BMI 24.90 kg/m   Physical Exam   ED Treatments / Results  Labs (all labs ordered are listed, but  only abnormal results are displayed) Labs Reviewed  GROUP A STREP BY PCR - Abnormal; Notable for the following components:      Result Value   Group A Strep by PCR DETECTED (*)    All other components within normal limits    EKG None  Radiology No results found.  Procedures Procedures (including critical care time)  Medications Ordered in ED Medications  acetaminophen (TYLENOL) tablet 650 mg (650 mg Oral Given 02/16/18 0928)  penicillin g benzathine (BICILLIN LA) 1200000 UNIT/2ML injection 1.2 Million Units (1.2 Million Units Intramuscular Given 02/16/18 1129)  ibuprofen (ADVIL,MOTRIN) tablet 400 mg (400 mg Oral Given 02/16/18 1141)     Initial Impression / Assessment and Plan / ED Course  I have reviewed the triage vital signs and the nursing notes.  Pertinent labs & imaging results that were available during my care  of the patient were reviewed by me and considered in my medical decision making (see chart for details).     Patient strep test was positive.  She opted for the Bicillin shot.  This was given without any reaction.  She was advised in symptomatic care.  Return precautions were given.  Final Clinical Impressions(s) / ED Diagnoses   Final diagnoses:  Strep pharyngitis    ED Discharge Orders    None       Rolan BuccoBelfi, Hjalmer Iovino, MD 02/16/18 1147

## 2018-02-16 NOTE — ED Triage Notes (Signed)
Pt c/o sore throat, fever; neck pain since yesterday

## 2019-02-12 ENCOUNTER — Ambulatory Visit (INDEPENDENT_AMBULATORY_CARE_PROVIDER_SITE_OTHER): Payer: 59 | Admitting: Podiatry

## 2019-02-12 ENCOUNTER — Encounter: Payer: Self-pay | Admitting: Podiatry

## 2019-02-12 ENCOUNTER — Ambulatory Visit (INDEPENDENT_AMBULATORY_CARE_PROVIDER_SITE_OTHER): Payer: 59

## 2019-02-12 ENCOUNTER — Other Ambulatory Visit: Payer: Self-pay

## 2019-02-12 VITALS — BP 128/89 | HR 80 | Temp 97.7°F | Resp 16

## 2019-02-12 DIAGNOSIS — L600 Ingrowing nail: Secondary | ICD-10-CM | POA: Diagnosis not present

## 2019-02-12 DIAGNOSIS — M779 Enthesopathy, unspecified: Secondary | ICD-10-CM

## 2019-02-12 MED ORDER — DICLOFENAC SODIUM 75 MG PO TBEC: 75.0000 mg | DELAYED_RELEASE_TABLET | Freq: Two times a day (BID) | ORAL | 2 refills | Status: DC

## 2019-02-12 MED ORDER — NEOMYCIN-POLYMYXIN-HC 3.5-10000-1 OT SOLN
OTIC | 1 refills | Status: DC
Start: 2019-02-12 — End: 2020-04-28

## 2019-02-12 NOTE — Progress Notes (Signed)
   Subjective:    Patient ID: Gloria Reilly, female    DOB: [birth year], 23 y.o.   MRN: [id number]  HPI    Review of Systems  All other systems reviewed and are negative.      Objective:   Physical Exam        Assessment & Plan:

## 2019-02-12 NOTE — Patient Instructions (Signed)

## 2019-02-12 NOTE — Progress Notes (Signed)
Subjective:   Patient ID: Gloria Reilly, female   DOB: 23 y.o.   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  Patient does not smoke likes to be active   Review of Systems  All other systems reviewed and are negative.       Objective:  Physical Exam Vitals signs and nursing note reviewed.  Constitutional:      Appearance: She is well-developed.  Pulmonary:     Effort: Pulmonary effort is normal.  Musculoskeletal: Normal range of motion.  Skin:    General: Skin is warm.  Neurological:     Mental Status: She is alert.     Neurovascular status was found to be intact muscle strength adequate range of motion within normal limits.  Patient is found to have incurvated hallux nail medial border left over right that is painful when pressed and make shoe gear difficult.  Patient has no redness no drainage associated with it and also has moderate discomfort in the lesser MPJs left with fluid buildup of a mild nature second and third MPJs     Assessment:  Ingrown toenail deformity left over right hallux with pain of the medial border and incurvated like nail bed and mild to moderate capsulitis forefoot left of the metatarsal phalangeal joints     Plan:  H&P both conditions and x-ray of left discussed.  At this point I recommended correction and I allowed patient to read and signed consent form explaining correction of the nail deformity.  Patient wants procedure and today I infiltrated the left hallux 60 mg Xylocaine Marcaine mixture I did sterile prep and then remove the medial border with sterile technique and expose the root and applied phenol 3 applications 30 seconds followed by alcohol lavage sterile dressing.  Gave instructions for soaks and gave instructions for drop usage and  patient will be seen back for [place] to reevaluate and was encouraged to call with questions and also wrote for diclofenac 75 mg twice daily and begin metatarsal pads to reduce pressure against the MPJs  X-rays indicate that there is no indications of arthritis or stress fracture associated with this with normal bone structure

## 2019-02-15 ENCOUNTER — Telehealth: Payer: Self-pay | Admitting: *Deleted

## 2019-02-15 NOTE — Telephone Encounter (Signed)
Pt called states she had ingrown toenail procedure with Dr. Paulla Dolly, and this morning is unable to put on her workshoe and feels she should be out of work another 2 days, return to work Wednesday 02/17/2019. I told pt the letter would be ready for pick up in 15-20 minutes. Dr. Josephina Shih the requested absence time.

## 2019-03-04 ENCOUNTER — Ambulatory Visit (INDEPENDENT_AMBULATORY_CARE_PROVIDER_SITE_OTHER): Payer: 59 | Admitting: Podiatry

## 2019-03-04 ENCOUNTER — Encounter: Payer: Self-pay | Admitting: Podiatry

## 2019-03-04 ENCOUNTER — Ambulatory Visit (INDEPENDENT_AMBULATORY_CARE_PROVIDER_SITE_OTHER): Payer: 59

## 2019-03-04 ENCOUNTER — Other Ambulatory Visit: Payer: Self-pay

## 2019-03-04 ENCOUNTER — Emergency Department (HOSPITAL_COMMUNITY)
Admission: EM | Admit: 2019-03-04 | Discharge: 2019-03-04 | Disposition: A | Discharge status: Home / Self Care | Payer: 59

## 2019-03-04 ENCOUNTER — Other Ambulatory Visit: Payer: Self-pay | Admitting: Podiatry

## 2019-03-04 DIAGNOSIS — S9032XA Contusion of left foot, initial encounter: Secondary | ICD-10-CM | POA: Diagnosis not present

## 2019-03-04 DIAGNOSIS — M779 Enthesopathy, unspecified: Secondary | ICD-10-CM

## 2019-03-04 NOTE — Progress Notes (Signed)
Subjective:   Patient ID: Gloria Reilly, female   DOB: 23 y.o.   MRN: [id number]   HPI Patient presents stating she dropped her cell phone on her left foot and her toenail has gotten sore after we have done the ingrown and the big toe joint is been bothering her and she is worried she may have broken something   ROS      Objective:  Physical Exam  Neurovascular status intact with patient noted to have forefoot inflammation left around the first MPJ and extending into the left big toe with mild looseness of the nailbed noted that is localized with good healing of the previous nail surgery that was done     Assessment:  Trauma to the left foot with possibility for fracture or other bony pathology     Plan:  H&P and reviewed x-ray condition.  At this point I recommended soaks anti-inflammatories and supportive shoes and hopefully will heal uneventfully and she will be seen back on an as-needed basis  X-ray indicates no indication of fracture or bone pathology associated with her condition

## 2019-03-18 ENCOUNTER — Other Ambulatory Visit: Payer: Self-pay

## 2019-03-18 ENCOUNTER — Encounter: Payer: Self-pay | Admitting: Podiatry

## 2019-03-18 ENCOUNTER — Ambulatory Visit (INDEPENDENT_AMBULATORY_CARE_PROVIDER_SITE_OTHER): Payer: 59 | Admitting: Podiatry

## 2019-03-18 DIAGNOSIS — L600 Ingrowing nail: Secondary | ICD-10-CM | POA: Diagnosis not present

## 2019-03-18 NOTE — Patient Instructions (Signed)

## 2019-03-19 ENCOUNTER — Encounter: Payer: Self-pay | Admitting: Podiatry

## 2019-03-22 NOTE — Progress Notes (Signed)
Subjective:   Patient ID: Gloria Reilly, female   DOB: 23 y.o.   MRN: [id number]   HPI Patient presents stating that my right big toenail is now ingrown and painful.  States the left is doing better after procedure and needs the right one fixed   ROS      Objective:  Physical Exam  Neurovascular status intact negative Homans sign noted with patient's right hallux medial border found to be incurvated and painful with no redness or active drainage noted.     Assessment:  Well-healed nail site left with chronic ingrown toenail medial border right hallux that is painful     Plan:  H&P reviewed condition and discussed correction allowing her to read consent form and signed.  I did sterile prep of the right big toe I infiltrated the right hallux 60 mg like Marcaine mixture remove the medial border exposed matrix and applied phenol 3 applications 30 seconds followed by alcohol lavage sterile dressing.  Gave instructions on soaks and to leave dressing on 24 hours but take it off earlier if needed and encouraged her to call with questions concerns she may have

## 2019-03-30 ENCOUNTER — Telehealth: Payer: Self-pay | Admitting: *Deleted

## 2019-03-30 NOTE — Telephone Encounter (Signed)
[employer] - Ander Purpura states they received a note for out of work 03/19/2019, and would like to know when pt can return to work with or without restrictions.

## 2019-03-31 NOTE — Telephone Encounter (Signed)
Should be fine to return to work

## 2019-03-31 NOTE — Telephone Encounter (Signed)
Left message on Leave and Absence voicemail informing of Dr. Mellody Drown 03/31/2019 8:22am recommendation.

## 2019-04-01 ENCOUNTER — Telehealth: Payer: Self-pay | Admitting: *Deleted

## 2019-04-01 NOTE — Telephone Encounter (Signed)
[employer] - Lauren called for confirmation that pt can return to work without restrictions.

## 2019-04-01 NOTE — Telephone Encounter (Signed)
[employer] - Marjory Lies and informed that Dr. Josephina Shih pt to return to work this week without restrictions.

## 2019-04-26 ENCOUNTER — Encounter (HOSPITAL_BASED_OUTPATIENT_CLINIC_OR_DEPARTMENT_OTHER): Payer: Self-pay | Admitting: Emergency Medicine

## 2019-04-26 ENCOUNTER — Other Ambulatory Visit: Payer: Self-pay

## 2019-04-26 ENCOUNTER — Emergency Department (HOSPITAL_BASED_OUTPATIENT_CLINIC_OR_DEPARTMENT_OTHER)
Admission: EM | Admit: 2019-04-26 | Discharge: 2019-04-26 | Disposition: A | Discharge status: Home / Self Care | Payer: 59 | Attending: Emergency Medicine | Admitting: Emergency Medicine

## 2019-04-26 DIAGNOSIS — I10 Essential (primary) hypertension: Secondary | ICD-10-CM | POA: Diagnosis not present

## 2019-04-26 DIAGNOSIS — K029 Dental caries, unspecified: Secondary | ICD-10-CM | POA: Insufficient documentation

## 2019-04-26 DIAGNOSIS — R519 Headache, unspecified: Secondary | ICD-10-CM | POA: Diagnosis present

## 2019-04-26 DIAGNOSIS — Z79899 Other long term (current) drug therapy: Secondary | ICD-10-CM | POA: Diagnosis not present

## 2019-04-26 MED ORDER — IBUPROFEN 600 MG PO TABS: 600.0000 mg | ORAL_TABLET | Freq: Four times a day (QID) | ORAL | 0 refills | Status: DC | PRN

## 2019-04-26 MED ORDER — HYDROCODONE-ACETAMINOPHEN 5-325 MG PO TABS: 1.0000 | ORAL_TABLET | ORAL | 0 refills | Status: DC | PRN

## 2019-04-26 MED ORDER — AMOXICILLIN 500 MG PO CAPS: 500.0000 mg | ORAL_CAPSULE | Freq: Three times a day (TID) | ORAL | 0 refills | Status: DC

## 2019-04-26 MED ORDER — AMOXICILLIN 500 MG PO CAPS
500.0000 mg | ORAL_CAPSULE | Freq: Once | ORAL | Status: AC
  Administered 2019-04-26: 08:00:00 500 mg via ORAL
  Filled 2019-04-26: qty 1

## 2019-04-26 MED ORDER — IBUPROFEN 400 MG PO TABS
600.0000 mg | ORAL_TABLET | Freq: Once | ORAL | Status: AC
  Administered 2019-04-26: 600 mg via ORAL
  Filled 2019-04-26: qty 1

## 2019-04-26 NOTE — ED Triage Notes (Addendum)
L sided facial pain, ? Dental vs ear. Pt states she isn't sure if she has a problem with her ear or a tooth on the L upper jaw. Reports pain to L upper gums near the back of her mouth extending to ear. Denies facial swelling or other sx. States she tried tylenol without relief. Last dose >6h ago. She is unsure of what time exactly.

## 2019-04-26 NOTE — ED Provider Notes (Signed)
[facility] EMERGENCY DEPARTMENT Provider Note   CSN: [id number] Arrival date & time: 04/26/19  8127     History   Chief Complaint Chief Complaint  Patient presents with  . L facial pain    HPI Gloria Reilly is a 23 y.o. female.     Pt presents to the ED today with left sided facial pain.  Pt said it's been hurting for about 1.5 days.  She has taken tylenol without help of sx.  Pt has not seen a dentist for a long time.  She denies any f/c.  No known covid exposures.     Past Medical History:  Diagnosis Date  . Hypertension 02/28/2013  . Normal labor 02/14/2013  . Postpartum care following vaginal delivery (8/17) 02/14/2013    Patient Active Problem List   Diagnosis Date Noted  . Anemia 03/06/2013  . Hypertension 02/28/2013    History reviewed. No pertinent surgical history.   OB History    Gravida  1   Para  1   Term  1   Preterm      AB      Living  1     SAB      TAB      Ectopic      Multiple      Live Births  1            Home Medications    Prior to Admission medications   Medication Sig Start Date End Date Taking? Authorizing Provider  amoxicillin (AMOXIL) 500 MG capsule Take 1 capsule (500 mg total) by mouth 3 (three) times daily. 04/26/19   Jacalyn Lefevre, MD  diclofenac (VOLTAREN) 75 MG EC tablet Take 1 tablet (75 mg total) by mouth 2 (two) times daily. 02/12/19   Lenn Sink, DPM  HYDROcodone-acetaminophen (NORCO/VICODIN) 5-325 MG tablet Take 1 tablet by mouth every 4 (four) hours as needed. 04/26/19   Jacalyn Lefevre, MD  ibuprofen (ADVIL) 600 MG tablet Take 1 tablet (600 mg total) by mouth every 6 (six) hours as needed. 04/26/19   Jacalyn Lefevre, MD  neomycin-polymyxin-hydrocortisone (CORTISPORIN) OTIC solution Apply 1-2 drops to toe after soaking BID 02/12/19   Lenn Sink, DPM    Family History Family History  Problem Relation Age of Onset  . Stroke Maternal Grandmother   . Diabetes Maternal Grandmother    . Diabetes Maternal Grandfather     Social History Social History   Tobacco Use  . Smoking status: Never Smoker  . Smokeless tobacco: Never Used  Substance Use Topics  . Alcohol use: Yes    Comment: occasional   . Drug use: Yes    Types: Marijuana    Comment: daily     Allergies   Patient has no known allergies.   Review of Systems Review of Systems  HENT:       Facial pain  All other systems reviewed and are negative.    Physical Exam Updated Vital Signs BP 122/85 (BP Location: Right Arm)   Pulse 72   Temp 98.6 F (37 C) (Oral)   Resp 18   Ht 5\' 7"  (1.702 m)   Wt 63.5 kg   LMP 04/12/2019   SpO2 99%   BMI 21.93 kg/m   Physical Exam Vitals signs and nursing note reviewed.  Constitutional:      Appearance: Normal appearance.  HENT:     Head: Normocephalic and atraumatic.     Right Ear: External ear normal.  Left Ear: External ear normal.     Nose: Nose normal.     Mouth/Throat:     Mouth: Mucous membranes are moist.     Dentition: Dental caries present.     Pharynx: Oropharynx is clear.     Comments: Dental pain Eyes:     Extraocular Movements: Extraocular movements intact.     Conjunctiva/sclera: Conjunctivae normal.     Pupils: Pupils are equal, round, and reactive to light.  Neck:     Musculoskeletal: Normal range of motion and neck supple.  Cardiovascular:     Rate and Rhythm: Normal rate and regular rhythm.     Pulses: Normal pulses.     Heart sounds: Normal heart sounds.  Pulmonary:     Effort: Pulmonary effort is normal.     Breath sounds: Normal breath sounds.  Abdominal:     General: Abdomen is flat. Bowel sounds are normal.     Palpations: Abdomen is soft.  Musculoskeletal: Normal range of motion.  Skin:    General: Skin is warm.     Capillary Refill: Capillary refill takes less than 2 seconds.  Neurological:     General: No focal deficit present.     Mental Status: She is alert and oriented to person, place, and time.   Psychiatric:        Mood and Affect: Mood normal.        Behavior: Behavior normal.      ED Treatments / Results  Labs (all labs ordered are listed, but only abnormal results are displayed) Labs Reviewed - No data to display  EKG None  Radiology No results found.  Procedures Procedures (including critical care time)  Medications Ordered in ED Medications  amoxicillin (AMOXIL) capsule 500 mg (has no administration in time range)  ibuprofen (ADVIL) tablet 600 mg (has no administration in time range)     Initial Impression / Assessment and Plan / ED Course  I have reviewed the triage vital signs and the nursing notes.  Pertinent labs & imaging results that were available during my care of the patient were reviewed by me and considered in my medical decision making (see chart for details).     I suspect facial pain is from her teeth.  She is instructed to f/u with a dentist.  She is offered Covid testing, but declines.  She knows to return if worse.  Final Clinical Impressions(s) / ED Diagnoses   Final diagnoses:  Dental caries    ED Discharge Orders         Ordered    amoxicillin (AMOXIL) 500 MG capsule  3 times daily     04/26/19 0725    ibuprofen (ADVIL) 600 MG tablet  Every 6 hours PRN     04/26/19 0725    HYDROcodone-acetaminophen (NORCO/VICODIN) 5-325 MG tablet  Every 4 hours PRN     04/26/19 0725           Isla Pence, MD 04/26/19 [phone]

## 2019-07-25 ENCOUNTER — Emergency Department (HOSPITAL_BASED_OUTPATIENT_CLINIC_OR_DEPARTMENT_OTHER)
Admission: EM | Admit: 2019-07-25 | Discharge: 2019-07-25 | Disposition: A | Discharge status: Home / Self Care | Payer: Medicaid Other | Attending: Emergency Medicine | Admitting: Emergency Medicine

## 2019-07-25 ENCOUNTER — Encounter (HOSPITAL_BASED_OUTPATIENT_CLINIC_OR_DEPARTMENT_OTHER): Payer: Self-pay | Admitting: Emergency Medicine

## 2019-07-25 ENCOUNTER — Other Ambulatory Visit: Payer: Self-pay

## 2019-07-25 DIAGNOSIS — H9202 Otalgia, left ear: Secondary | ICD-10-CM | POA: Insufficient documentation

## 2019-07-25 DIAGNOSIS — I10 Essential (primary) hypertension: Secondary | ICD-10-CM | POA: Diagnosis not present

## 2019-07-25 DIAGNOSIS — K029 Dental caries, unspecified: Secondary | ICD-10-CM

## 2019-07-25 DIAGNOSIS — F1210 Cannabis abuse, uncomplicated: Secondary | ICD-10-CM | POA: Insufficient documentation

## 2019-07-25 DIAGNOSIS — K0889 Other specified disorders of teeth and supporting structures: Secondary | ICD-10-CM | POA: Diagnosis present

## 2019-07-25 MED ORDER — OXYCODONE-ACETAMINOPHEN 5-325 MG PO TABS
2.0000 | ORAL_TABLET | Freq: Once | ORAL | Status: AC
  Administered 2019-07-25: 2 via ORAL
  Filled 2019-07-25: qty 2

## 2019-07-25 MED ORDER — OXYCODONE-ACETAMINOPHEN 5-325 MG PO TABS: 1.0000 | ORAL_TABLET | Freq: Four times a day (QID) | ORAL | 0 refills | Status: DC | PRN

## 2019-07-25 MED ORDER — CLINDAMYCIN HCL 150 MG PO CAPS
300.0000 mg | ORAL_CAPSULE | Freq: Once | ORAL | Status: AC
  Administered 2019-07-25: 300 mg via ORAL
  Filled 2019-07-25: qty 2

## 2019-07-25 MED ORDER — CLINDAMYCIN HCL 300 MG PO CAPS: 300.0000 mg | ORAL_CAPSULE | Freq: Four times a day (QID) | ORAL | 0 refills | Status: DC

## 2019-07-25 NOTE — Discharge Instructions (Signed)
Begin taking clindamycin as prescribed.  Continue taking ibuprofen 600 mg every 6 hours as needed for pain.  You can take the Percocet prescribed this morning in addition to the ibuprofen as needed for pain.  Follow-up with dentistry in the next 2 to 3 days.

## 2019-07-25 NOTE — ED Triage Notes (Signed)
L sided dental pain and ear pain x3 days. Denies fever, nausea and vomiting. States she's taking 800mg  ibuprofen and tylenol PM but is no longer effective.

## 2019-07-25 NOTE — ED Provider Notes (Signed)
[facility] EMERGENCY DEPARTMENT Provider Note   CSN: [id number] Arrival date & time: 07/25/19  0456     History Chief Complaint  Patient presents with  . dental pain, ear pain    Gloria Reilly is a 24 y.o. female.  Patient is a 24 year old female with history of hypertension.  She presents today for evaluation of severe dental pain.  She has been experiencing this for the past 3 days and it is rapidly worsening.  She describes severe pain to her left jaw radiating to her left ear.  She denies any fevers or chills.  She denies any difficulty breathing or swallowing.  She has tried ibuprofen with no relief.  The history is provided by the patient.       Past Medical History:  Diagnosis Date  . Hypertension 02/28/2013  . Normal labor 02/14/2013  . Postpartum care following vaginal delivery (8/17) 02/14/2013    Patient Active Problem List   Diagnosis Date Noted  . Anemia 03/06/2013  . Hypertension 02/28/2013    History reviewed. No pertinent surgical history.   OB History    Gravida  1   Para  1   Term  1   Preterm      AB      Living  1     SAB      TAB      Ectopic      Multiple      Live Births  1           Family History  Problem Relation Age of Onset  . Stroke Maternal Grandmother   . Diabetes Maternal Grandmother   . Diabetes Maternal Grandfather     Social History   Tobacco Use  . Smoking status: Never Smoker  . Smokeless tobacco: Never Used  Substance Use Topics  . Alcohol use: Yes    Comment: occasional   . Drug use: Yes    Types: Marijuana    Comment: daily    Home Medications Prior to Admission medications   Medication Sig Start Date End Date Taking? Authorizing Provider  amoxicillin (AMOXIL) 500 MG capsule Take 1 capsule (500 mg total) by mouth 3 (three) times daily. 04/26/19   Isla Pence, MD  diclofenac (VOLTAREN) 75 MG EC tablet Take 1 tablet (75 mg total) by mouth 2 (two) times daily. 02/12/19   Wallene Huh, DPM  HYDROcodone-acetaminophen (NORCO/VICODIN) 5-325 MG tablet Take 1 tablet by mouth every 4 (four) hours as needed. 04/26/19   Isla Pence, MD  ibuprofen (ADVIL) 600 MG tablet Take 1 tablet (600 mg total) by mouth every 6 (six) hours as needed. 04/26/19   Isla Pence, MD  neomycin-polymyxin-hydrocortisone (CORTISPORIN) OTIC solution Apply 1-2 drops to toe after soaking BID 02/12/19   Wallene Huh, DPM    Allergies    Patient has no known allergies.  Review of Systems   Review of Systems  All other systems reviewed and are negative.   Physical Exam Updated Vital Signs BP (!) 168/113 (BP Location: Right Arm)   Pulse 66   Temp 98.9 F (37.2 C) (Oral)   Resp 18   Ht 5\' 6"  (1.676 m)   Wt 74.8 kg   SpO2 100%   BMI 26.63 kg/m   Physical Exam Vitals and nursing note reviewed.  Constitutional:      General: She is not in acute distress.    Appearance: She is not ill-appearing.     Comments: Patient appears very  anxious and uncomfortable.  She is writhing and hyperventilating.  HENT:     Head: Normocephalic and atraumatic.     Right Ear: Tympanic membrane and ear canal normal.     Left Ear: Tympanic membrane and ear canal normal.     Mouth/Throat:     Mouth: Mucous membranes are moist.     Pharynx: No oropharyngeal exudate or posterior oropharyngeal erythema.     Comments: The left lower second molar is heavily decayed with surrounding gingival inflammation.  It is tender to the touch.  There is no definite abscess noted.  There is no trismus and no stridor. Pulmonary:     Effort: Pulmonary effort is normal.     Breath sounds: No stridor.  Musculoskeletal:     Cervical back: Normal range of motion and neck supple. No tenderness.  Lymphadenopathy:     Cervical: No cervical adenopathy.  Skin:    General: Skin is warm and dry.     ED Results / Procedures / Treatments   Labs (all labs ordered are listed, but only abnormal results are displayed) Labs  Reviewed - No data to display  EKG None  Radiology No results found.  Procedures Procedures (including critical care time)  Medications Ordered in ED Medications  oxyCODONE-acetaminophen (PERCOCET/ROXICET) 5-325 MG per tablet 2 tablet (has no administration in time range)  clindamycin (CLEOCIN) capsule 300 mg (has no administration in time range)    ED Course  I have reviewed the triage vital signs and the nursing notes.  Pertinent labs & imaging results that were available during my care of the patient were reviewed by me and considered in my medical decision making (see chart for details).    MDM Rules/Calculators/A&P  Patient with dental pain.  This will be treated with clindamycin and pain medication.  She is to follow-up with dentistry as soon as possible.  Final Clinical Impression(s) / ED Diagnoses Final diagnoses:  None    Rx / DC Orders ED Discharge Orders    None       Geoffery Lyons, MD 07/25/19 [phone]

## 2019-12-30 DIAGNOSIS — Z419 Encounter for procedure for purposes other than remedying health state, unspecified: Secondary | ICD-10-CM | POA: Diagnosis not present

## 2020-01-30 DIAGNOSIS — Z419 Encounter for procedure for purposes other than remedying health state, unspecified: Secondary | ICD-10-CM | POA: Diagnosis not present

## 2020-03-01 DIAGNOSIS — Z419 Encounter for procedure for purposes other than remedying health state, unspecified: Secondary | ICD-10-CM | POA: Diagnosis not present

## 2020-03-31 DIAGNOSIS — Z419 Encounter for procedure for purposes other than remedying health state, unspecified: Secondary | ICD-10-CM | POA: Diagnosis not present

## 2020-04-28 ENCOUNTER — Encounter (HOSPITAL_BASED_OUTPATIENT_CLINIC_OR_DEPARTMENT_OTHER): Payer: Self-pay | Admitting: *Deleted

## 2020-04-28 ENCOUNTER — Emergency Department (HOSPITAL_BASED_OUTPATIENT_CLINIC_OR_DEPARTMENT_OTHER)
Admission: EM | Admit: 2020-04-28 | Discharge: 2020-04-28 | Disposition: A | Discharge status: Home / Self Care | Payer: Medicaid Other | Attending: Emergency Medicine | Admitting: Emergency Medicine

## 2020-04-28 ENCOUNTER — Emergency Department (HOSPITAL_BASED_OUTPATIENT_CLINIC_OR_DEPARTMENT_OTHER): Payer: Medicaid Other

## 2020-04-28 ENCOUNTER — Other Ambulatory Visit: Payer: Self-pay

## 2020-04-28 DIAGNOSIS — I10 Essential (primary) hypertension: Secondary | ICD-10-CM | POA: Diagnosis not present

## 2020-04-28 DIAGNOSIS — M7989 Other specified soft tissue disorders: Secondary | ICD-10-CM | POA: Diagnosis not present

## 2020-04-28 DIAGNOSIS — M79641 Pain in right hand: Secondary | ICD-10-CM | POA: Insufficient documentation

## 2020-04-28 DIAGNOSIS — M25531 Pain in right wrist: Secondary | ICD-10-CM | POA: Diagnosis not present

## 2020-04-28 MED ORDER — NAPROXEN 500 MG PO TABS: 500.0000 mg | ORAL_TABLET | Freq: Two times a day (BID) | ORAL | 0 refills | Status: DC

## 2020-04-28 NOTE — ED Triage Notes (Signed)
Presents with Rt thumb/ palm pain, onset yesterday. Denies any injury or trauma to site

## 2020-04-28 NOTE — ED Provider Notes (Signed)
[facility] EMERGENCY DEPARTMENT Provider Note   CSN: [id number] Arrival date & time: 04/28/20  1007     History Chief Complaint  Patient presents with   Thumb Pain    Gloria Reilly is a 24 y.o. female.  Patient presents the emergency department for evaluation of right hand pain starting yesterday.  Patient has had pain in her wrist in the past.  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  She denies acute injury.  No treatments prior to arrival.  Pain is worse at the base of the thumb extending onto the radial aspect of the palm and wrist.  She also has pain proximal to her index finger.  She reports reports mild swelling.  No elbow or shoulder pain.  No fevers or other joint pains.        Past Medical History:  Diagnosis Date   Hypertension 02/28/2013   Normal labor 02/14/2013   Postpartum care following vaginal delivery (8/17) 02/14/2013    Patient Active Problem List   Diagnosis Date Noted   Anemia 03/06/2013   Hypertension 02/28/2013    History reviewed. No pertinent surgical history.   OB History    Gravida  1   Para  1   Term  1   Preterm      AB      Living  1     SAB      TAB      Ectopic      Multiple      Live Births  1           Family History  Problem Relation Age of Onset   Stroke Maternal Grandmother    Diabetes Maternal Grandmother    Diabetes Maternal Grandfather     Social History   Tobacco Use   Smoking status: Never Smoker   Smokeless tobacco: Never Used  Substance Use Topics   Alcohol use: Yes    Comment: occasional    Drug use: Yes    Types: Marijuana    Comment: daily    Home Medications Prior to Admission medications   Medication Sig Start Date End Date Taking? Authorizing Provider  amoxicillin (AMOXIL) 500 MG capsule Take 1 capsule (500 mg total) by mouth 3 (three) times daily. 04/26/19   Jacalyn Lefevre, MD    clindamycin (CLEOCIN) 300 MG capsule Take 1 capsule (300 mg total) by mouth 4 (four) times daily. X 7 days 07/25/19   Geoffery Lyons, MD  diclofenac (VOLTAREN) 75 MG EC tablet Take 1 tablet (75 mg total) by mouth 2 (two) times daily. 02/12/19   Lenn Sink, DPM  HYDROcodone-acetaminophen (NORCO/VICODIN) 5-325 MG tablet Take 1 tablet by mouth every 4 (four) hours as needed. 04/26/19   Jacalyn Lefevre, MD  ibuprofen (ADVIL) 600 MG tablet Take 1 tablet (600 mg total) by mouth every 6 (six) hours as needed. 04/26/19   Jacalyn Lefevre, MD  neomycin-polymyxin-hydrocortisone (CORTISPORIN) OTIC solution Apply 1-2 drops to toe after soaking BID 02/12/19   Lenn Sink, DPM  oxyCODONE-acetaminophen (PERCOCET) 5-325 MG tablet Take 1-2 tablets by mouth every 6 (six) hours as needed. 07/25/19   Geoffery Lyons, MD    Allergies    Patient has no known allergies.  Review of Systems   Review of Systems  Constitutional: Negative for activity change.  Musculoskeletal: Positive for arthralgias and joint swelling. Negative for back pain and neck pain.  Skin: Negative for wound.  Neurological: Negative for weakness and numbness.    Physical Exam Updated Vital Signs BP (!) 134/109 (BP Location: Right Arm)    Pulse (!) 56    Temp 98.5 F (36.9 C) (Oral)    Resp 16    Ht 5\' 6"  (1.676 m)    Wt 77.1 kg    SpO2 100%    BMI 27.44 kg/m   Physical Exam Vitals and nursing note reviewed.  Constitutional:      Appearance: She is well-developed.  HENT:     Head: Normocephalic and atraumatic.  Eyes:     Pupils: Pupils are equal, round, and reactive to light.  Cardiovascular:     Pulses: Normal pulses. No decreased pulses.  Musculoskeletal:        General: Tenderness present.     Cervical back: Normal range of motion and neck supple.     Comments: Right hand and wrist: Patient has tenderness over the base of the thumb, index finger, and radial aspect of the palm.  Negative Finkelstein's.  Trace swelling.  No  redness or signs of cellulitis.  Pain seems to be exacerbated by ranging the thumb in all directions.  She has weak grip and pinching strength.  Skin:    General: Skin is warm and dry.  Neurological:     Mental Status: She is alert.     Sensory: No sensory deficit.     Comments: Motor, sensation, and vascular distal to the injury is fully intact.      ED Results / Procedures / Treatments   Labs (all labs ordered are listed, but only abnormal results are displayed) Labs Reviewed - No data to display  EKG None  Radiology No results found.  Procedures Procedures (including critical care time)  Medications Ordered in ED Medications - No data to display  ED Course  I have reviewed the triage vital signs and the nursing notes.  Pertinent labs & imaging results that were available during my care of the patient were reviewed by me and considered in my medical decision making (see chart for details).  Patient seen and examined.    Vital signs reviewed and are as follows: BP (!) 134/109 (BP Location: Right Arm)    Pulse (!) 56    Temp 98.5 F (36.9 C) (Oral)    Resp 16    Ht 5\' 6"  (1.676 m)    Wt 77.1 kg    SpO2 100%    BMI 27.44 kg/m   Plan: X-ray due to recurrent pain, thumb spica splint (Velcro).  If negative, will prescribe course of anti-inflammatories, rice protocol, sports medicine follow-up.  Patient in agreement.  11:16 AM patient updated on x-ray results.  She has been provided with a thumb spica splint.  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    MDM Rules/Calculators/A&P                          Patient with right hand pain, evaluation and follow-up as above.   Final Clinical Impression(s) / ED Diagnoses Final diagnoses:  Pain of right hand    Rx / DC Orders ED Discharge Orders         Ordered    naproxen (NAPROSYN) 500 MG tablet  2 times daily        04/28/20 1116           , PA-C 04/28/20 1117    Renne Crigler  M, MD 04/28/20 1550

## 2020-04-28 NOTE — ED Notes (Signed)
AVS reviewed with client, reinforced wrist splint education and teaching provided by EMT. Also informed pt that Rx has been electronically sent to her pharmacy of choice. Opportunity for questions provided. Copy of AVS given to pt as well.

## 2020-04-28 NOTE — ED Notes (Signed)
ED Provider at bedside. Pt states she has not taken any meds for pain

## 2020-04-28 NOTE — Discharge Instructions (Signed)
Please read and follow all provided instructions.  Your diagnoses today include:  1. Pain of right hand     Tests performed today include:  An x-ray of the affected area - does NOT show any broken bones, shows fusion of the lunate and triquetrum bones in the hand.  This is because lunotriquetral coalition and can cause wrist and hand pain chronically.  Vital signs. See below for your results today.   Medications prescribed:   Naproxen - anti-inflammatory pain medication  Do not exceed 500mg  naproxen every 12 hours, take with food  You have been prescribed an anti-inflammatory medication or NSAID. Take with food. Take smallest effective dose for the shortest duration needed for your pain. Stop taking if you experience stomach pain or vomiting.   Take any prescribed medications only as directed.  Home care instructions:   Follow any educational materials contained in this packet  Follow R.I.C.E. Protocol:  R - rest your injury   I  - use ice on injury without applying directly to skin  C - compress injury with bandage or splint  E - elevate the injury as much as possible  Follow-up instructions: Please follow-up with the provided orthopedic physician (bone specialist) if you continue to have significant pain in 1 week.   Return instructions:   Please return if your fingers are numb or tingling, appear gray or blue, or you have severe pain (also elevate the arm and loosen splint or wrap if you were given one)  Please return to the Emergency Department if you experience worsening symptoms.   Please return if you have any other emergent concerns.  Additional Information:  Your vital signs today were: BP (!) 134/109 (BP Location: Right Arm)   Pulse (!) 56   Temp 98.5 F (36.9 C) (Oral)   Resp 16   Ht 5\' 6"  (1.676 m)   Wt 77.1 kg   SpO2 100%   BMI 27.44 kg/m  If your blood pressure (BP) was elevated above 135/85 this visit, please have this repeated by your doctor  within one month. --------------

## 2020-05-01 DIAGNOSIS — Z419 Encounter for procedure for purposes other than remedying health state, unspecified: Secondary | ICD-10-CM | POA: Diagnosis not present

## 2020-05-04 ENCOUNTER — Emergency Department (HOSPITAL_BASED_OUTPATIENT_CLINIC_OR_DEPARTMENT_OTHER): Payer: Medicaid Other

## 2020-05-04 ENCOUNTER — Encounter (HOSPITAL_BASED_OUTPATIENT_CLINIC_OR_DEPARTMENT_OTHER): Payer: Self-pay | Admitting: *Deleted

## 2020-05-04 ENCOUNTER — Other Ambulatory Visit: Payer: Self-pay

## 2020-05-04 ENCOUNTER — Emergency Department (HOSPITAL_BASED_OUTPATIENT_CLINIC_OR_DEPARTMENT_OTHER)
Admission: EM | Admit: 2020-05-04 | Discharge: 2020-05-04 | Disposition: A | Discharge status: Home / Self Care | Payer: Medicaid Other | Attending: Emergency Medicine | Admitting: Emergency Medicine

## 2020-05-04 DIAGNOSIS — W010XXA Fall on same level from slipping, tripping and stumbling without subsequent striking against object, initial encounter: Secondary | ICD-10-CM | POA: Insufficient documentation

## 2020-05-04 DIAGNOSIS — Y9341 Activity, dancing: Secondary | ICD-10-CM | POA: Insufficient documentation

## 2020-05-04 DIAGNOSIS — S92424A Nondisplaced fracture of distal phalanx of right great toe, initial encounter for closed fracture: Secondary | ICD-10-CM | POA: Insufficient documentation

## 2020-05-04 DIAGNOSIS — I10 Essential (primary) hypertension: Secondary | ICD-10-CM | POA: Diagnosis not present

## 2020-05-04 DIAGNOSIS — S99921A Unspecified injury of right foot, initial encounter: Secondary | ICD-10-CM | POA: Diagnosis present

## 2020-05-04 DIAGNOSIS — S92404A Nondisplaced unspecified fracture of right great toe, initial encounter for closed fracture: Secondary | ICD-10-CM | POA: Diagnosis not present

## 2020-05-04 MED ORDER — IBUPROFEN 200 MG PO TABS
600.0000 mg | ORAL_TABLET | Freq: Once | ORAL | Status: AC
  Administered 2020-05-04: 600 mg via ORAL
  Filled 2020-05-04: qty 1

## 2020-05-04 NOTE — ED Triage Notes (Signed)
Hit her right great toe on the wall while on dance rehearsal.

## 2020-05-04 NOTE — Discharge Instructions (Signed)
Seizure-free toe fracture.  I placed you in a boot please wear during the day and you may take off at nighttime. I recommend taking over-the-counter pain medications like ibuprofen and/or Tylenol every 6 as needed.  Please follow dosage and on the back of bottle.  I also recommend applying ice to the area and keeping it elevated as this can help decrease swelling and inflammation.  Come back to the emergency department if you develop chest pain, shortness of breath, severe abdominal pain, uncontrolled nausea, vomiting, diarrhea.

## 2020-05-04 NOTE — ED Notes (Signed)
Pt transported to Xray. 

## 2020-05-04 NOTE — ED Notes (Signed)
Pt returned from xray

## 2020-05-04 NOTE — ED Provider Notes (Signed)
[facility] EMERGENCY DEPARTMENT Provider Note   CSN: [id number] Arrival date & time: 05/04/20  9150     History Chief Complaint  Patient presents with  . Toe injury    Gloria Reilly is a 24 y.o. female.  HPI   Patient with no significant medical history presents emergency department with chief complaint of right great toe pain.  Patient states she injured her toe yesterday while dancing, she states she tripped and fell landing on her big toe.  She denies hitting her head, losing consciousness, is not on any anticoagulant.  She endorses excruciating pain at the middle joint of her right great toe.  She is unable to bear weight on it as it causes her excruciating pain, she has been applying ice to the area, taking over-the-counter pain medications and applying multiple socks to her foot which seems to help.  Patient denies headache, fever, chills, shortness of breath, chest pain, abdominal pain, nausea, vomiting, diarrhea, pedal edema.  Past Medical History:  Diagnosis Date  . Hypertension 02/28/2013  . Normal labor 02/14/2013  . Postpartum care following vaginal delivery (8/17) 02/14/2013    Patient Active Problem List   Diagnosis Date Noted  . Anemia 03/06/2013  . Hypertension 02/28/2013    History reviewed. No pertinent surgical history.   OB History    Gravida  1   Para  1   Term  1   Preterm      AB      Living  1     SAB      TAB      Ectopic      Multiple      Live Births  1           Family History  Problem Relation Age of Onset  . Stroke Maternal Grandmother   . Diabetes Maternal Grandmother   . Diabetes Maternal Grandfather     Social History   Tobacco Use  . Smoking status: Never Smoker  . Smokeless tobacco: Never Used  Substance Use Topics  . Alcohol use: Yes    Comment: occasional   . Drug use: Yes    Types: Marijuana    Comment: daily    Home Medications Prior to Admission medications   Medication Sig Start Date  End Date Taking? Authorizing Provider  naproxen (NAPROSYN) 500 MG tablet Take 1 tablet (500 mg total) by mouth 2 (two) times daily. 04/28/20   Renne Crigler, PA-C    Allergies    Patient has no known allergies.  Review of Systems   Review of Systems  Constitutional: Negative for chills and fever.  HENT: Negative for congestion, tinnitus, trouble swallowing and voice change.   Respiratory: Negative for cough and shortness of breath.   Cardiovascular: Negative for chest pain.  Gastrointestinal: Negative for abdominal pain, diarrhea, nausea and vomiting.  Genitourinary: Negative for dysuria, enuresis, flank pain, frequency and vaginal bleeding.  Musculoskeletal: Negative for back pain.       Patient endorses right great toe pain  Skin: Negative for rash.  Neurological: Negative for dizziness.  Hematological: Does not bruise/bleed easily.    Physical Exam Updated Vital Signs BP (!) 136/106 (BP Location: Right Arm)   Pulse 62   Temp 98.2 F (36.8 C) (Oral)   Resp 18   Ht 5\' 6"  (1.676 m)   Wt 77.1 kg   SpO2 100%   BMI 27.44 kg/m   Physical Exam Vitals and nursing note reviewed.  Constitutional:  General: She is not in acute distress.    Appearance: Normal appearance. She is not ill-appearing or diaphoretic.  HENT:     Head: Normocephalic and atraumatic.     Nose: No congestion or rhinorrhea.  Eyes:     General: No scleral icterus.       Right eye: No discharge.        Left eye: No discharge.     Conjunctiva/sclera: Conjunctivae normal.  Pulmonary:     Effort: Pulmonary effort is normal.     Breath sounds: Normal breath sounds.  Musculoskeletal:        General: Swelling and tenderness present.     Cervical back: Neck supple.     Right lower leg: No edema.     Left lower leg: No edema.     Comments: Patient's right foot was visualized slight edema noted at her great toe, no erythema, ecchymosis or other gross abnormalities noted.  She had tenderness to palpation  at the middle joint of her great toe, she had full range of motion at the distal, middle, proximal joints, neurovascular fully intact.  Skin:    General: Skin is warm and dry.     Coloration: Skin is not jaundiced or pale.  Neurological:     Mental Status: She is alert and oriented to person, place, and time.  Psychiatric:        Mood and Affect: Mood normal.     ED Results / Procedures / Treatments   Labs (all labs ordered are listed, but only abnormal results are displayed) Labs Reviewed - No data to display  EKG None  Radiology DG Foot Complete Right  Result Date: 05/04/2020 CLINICAL DATA:  Right great toe pain following a jamming injury last night. EXAM: RIGHT FOOT COMPLETE - 3+ VIEW COMPARISON:  Right foot dated 08/06/2017 FINDINGS: Oblique fracture through the lateral aspect of the base of the 1st distal phalanx, extending into the 1st IP joint. No significant displacement or angulation. Mild dorsal navicular spur formation at the talonavicular joint. IMPRESSION: Nondisplaced intra-articular fracture of the base of the 1st distal phalanx. Electronically Signed   By: Beckie Salts M.D.   On: 05/04/2020 10:59    Procedures Procedures (including critical care time)  Medications Ordered in ED Medications  ibuprofen (ADVIL) tablet 600 mg (600 mg Oral Given 05/04/20 1032)    ED Course  I have reviewed the triage vital signs and the nursing notes.  Pertinent labs & imaging results that were available during my care of the patient were reviewed by me and considered in my medical decision making (see chart for details).    MDM Rules/Calculators/A&P                          Patient presents with right great toe pain she is alert, did not appear in acute distress, vital signs reassuring.  Will order right foot x-ray for further evaluation.  X-ray reveals nondisplaced anterior articular fracture of the base of the 1st distal phalanx  Low suspicion for dislocation as x-ray shows  fracture without dislocation.  Low suspicion for ligament or tendon damage as area was palpated no gross defects noted, they had full range of motion at the distal, middle, proximal joints.  Low suspicion for compartment syndrome as area was palpated it was soft to the touch, neurovascular fully intact.  I suspect patient has fracture at the 1st distal phalanx confirmed by x-ray.  Will place patient  in a postop boot and have her follow-up with Ortho or ankle and foot for further evaluation.  Vital signs have remained stable, no indication for hospital admission.  Patient discussed with attending and they agreed with assessment and plan.  Patient given at home care as well strict return precautions.  Patient verbalized that they understood agreed to said plan.   Final Clinical Impression(s) / ED Diagnoses Final diagnoses:  Nondisplaced fracture of distal phalanx of right great toe, initial encounter for closed fracture    Rx / DC Orders ED Discharge Orders    None       Carroll Sage, PA-C 05/04/20 1136    Vanetta Mulders, MD 05/05/20 [phone]

## 2020-05-04 NOTE — ED Notes (Signed)
GAVE PT. ICE PACK FOR HER TOE.

## 2020-05-19 ENCOUNTER — Ambulatory Visit: Payer: Medicaid Other | Admitting: Podiatry

## 2020-05-31 ENCOUNTER — Other Ambulatory Visit: Payer: Self-pay

## 2020-05-31 ENCOUNTER — Ambulatory Visit (INDEPENDENT_AMBULATORY_CARE_PROVIDER_SITE_OTHER): Payer: Medicaid Other | Admitting: Podiatry

## 2020-05-31 ENCOUNTER — Other Ambulatory Visit: Payer: Self-pay | Admitting: Podiatry

## 2020-05-31 ENCOUNTER — Encounter: Payer: Self-pay | Admitting: Podiatry

## 2020-05-31 ENCOUNTER — Ambulatory Visit (INDEPENDENT_AMBULATORY_CARE_PROVIDER_SITE_OTHER): Payer: Medicaid Other

## 2020-05-31 DIAGNOSIS — S92421A Displaced fracture of distal phalanx of right great toe, initial encounter for closed fracture: Secondary | ICD-10-CM | POA: Diagnosis not present

## 2020-05-31 DIAGNOSIS — M79672 Pain in left foot: Secondary | ICD-10-CM | POA: Diagnosis not present

## 2020-05-31 DIAGNOSIS — Z419 Encounter for procedure for purposes other than remedying health state, unspecified: Secondary | ICD-10-CM | POA: Diagnosis not present

## 2020-05-31 NOTE — Progress Notes (Signed)
Subjective:   Patient ID: Gloria Reilly, female   DOB: 24 y.o.   MRN: [id number]   HPI Patient states that she broke her big toe around a month ago and its been sore and she wanted it checked and not sure whether she needs to do anything for it.  States is not as sore but still bothers her neuro   ROS      Objective:  Physical Exam  Vascular status intact with inflammation pain of the inner phalangeal joint right hallux medial lateral side with pain upon palpation     Assessment:  Probability for fracture of the proximal distal phalanx right hallux     Plan:  H&P x-ray done and I went ahead today and I discussed immobilization and that the possibilities [place] we may need to remove a small chip from the right big toe.  Patient will be watched and if it continues to be symptomatic in the next several months will be seen back  X-rays indicate chip fracture of the right hallux lateral side

## 2020-07-01 DIAGNOSIS — Z419 Encounter for procedure for purposes other than remedying health state, unspecified: Secondary | ICD-10-CM | POA: Diagnosis not present

## 2020-08-01 DIAGNOSIS — Z419 Encounter for procedure for purposes other than remedying health state, unspecified: Secondary | ICD-10-CM | POA: Diagnosis not present

## 2020-08-29 DIAGNOSIS — Z419 Encounter for procedure for purposes other than remedying health state, unspecified: Secondary | ICD-10-CM | POA: Diagnosis not present

## 2020-09-21 DIAGNOSIS — Z03818 Encounter for observation for suspected exposure to other biological agents ruled out: Secondary | ICD-10-CM | POA: Diagnosis not present

## 2020-09-21 DIAGNOSIS — Z20822 Contact with and (suspected) exposure to covid-19: Secondary | ICD-10-CM | POA: Diagnosis not present

## 2020-09-29 DIAGNOSIS — Z419 Encounter for procedure for purposes other than remedying health state, unspecified: Secondary | ICD-10-CM | POA: Diagnosis not present

## 2020-10-29 DIAGNOSIS — Z419 Encounter for procedure for purposes other than remedying health state, unspecified: Secondary | ICD-10-CM | POA: Diagnosis not present

## 2020-11-29 DIAGNOSIS — Z419 Encounter for procedure for purposes other than remedying health state, unspecified: Secondary | ICD-10-CM | POA: Diagnosis not present

## 2020-12-29 DIAGNOSIS — Z419 Encounter for procedure for purposes other than remedying health state, unspecified: Secondary | ICD-10-CM | POA: Diagnosis not present

## 2021-01-29 DIAGNOSIS — Z419 Encounter for procedure for purposes other than remedying health state, unspecified: Secondary | ICD-10-CM | POA: Diagnosis not present

## 2021-03-01 DIAGNOSIS — Z419 Encounter for procedure for purposes other than remedying health state, unspecified: Secondary | ICD-10-CM | POA: Diagnosis not present

## 2021-03-31 DIAGNOSIS — Z419 Encounter for procedure for purposes other than remedying health state, unspecified: Secondary | ICD-10-CM | POA: Diagnosis not present

## 2021-05-01 DIAGNOSIS — Z419 Encounter for procedure for purposes other than remedying health state, unspecified: Secondary | ICD-10-CM | POA: Diagnosis not present

## 2021-05-31 DIAGNOSIS — Z419 Encounter for procedure for purposes other than remedying health state, unspecified: Secondary | ICD-10-CM | POA: Diagnosis not present

## 2021-07-01 DIAGNOSIS — Z419 Encounter for procedure for purposes other than remedying health state, unspecified: Secondary | ICD-10-CM | POA: Diagnosis not present

## 2021-08-01 DIAGNOSIS — Z419 Encounter for procedure for purposes other than remedying health state, unspecified: Secondary | ICD-10-CM | POA: Diagnosis not present

## 2021-08-29 DIAGNOSIS — Z419 Encounter for procedure for purposes other than remedying health state, unspecified: Secondary | ICD-10-CM | POA: Diagnosis not present

## 2021-09-14 ENCOUNTER — Encounter: Payer: Medicaid Other | Admitting: Family Medicine

## 2021-09-29 DIAGNOSIS — Z419 Encounter for procedure for purposes other than remedying health state, unspecified: Secondary | ICD-10-CM | POA: Diagnosis not present

## 2021-10-29 DIAGNOSIS — Z419 Encounter for procedure for purposes other than remedying health state, unspecified: Secondary | ICD-10-CM | POA: Diagnosis not present

## 2021-11-13 ENCOUNTER — Ambulatory Visit (INDEPENDENT_AMBULATORY_CARE_PROVIDER_SITE_OTHER): Payer: Medicaid Other | Admitting: Family Medicine

## 2021-11-13 ENCOUNTER — Encounter: Payer: Self-pay | Admitting: Family Medicine

## 2021-11-13 VITALS — BP 149/95 | HR 65 | Ht 67.0 in | Wt 169.1 lb

## 2021-11-13 DIAGNOSIS — Z3046 Encounter for surveillance of implantable subdermal contraceptive: Secondary | ICD-10-CM

## 2021-11-13 DIAGNOSIS — I10 Essential (primary) hypertension: Secondary | ICD-10-CM

## 2021-11-13 MED ORDER — AMLODIPINE BESYLATE 5 MG PO TABS: 5.0000 mg | ORAL_TABLET | Freq: Every day | ORAL | 3 refills | Status: DC

## 2021-11-13 NOTE — Progress Notes (Signed)
Nexplanon placed in 2014 by Dr Cherly Hensen. ?

## 2021-11-13 NOTE — Progress Notes (Signed)
? ? ? ?  GYNECOLOGY OFFICE PROCEDURE NOTE ? ?Gloria Reilly is a 26 y.o. G1P1001 here for Nexplanon removal.  Last pap smear was not on file, will schedule follow up visit for this. ? ?Nexplanon removal Procedure ?Patient identified, informed consent performed, consent signed.   Appropriate time out taken. Nexplanon site identified.  Area prepped in usual sterile fashon. One ml of 1% lidocaine was used to anesthetize the area at the distal end of the implant. A small stab incision was made right beside the implant on the distal portion.  The Nexplanon rod was grasped using hemostats and removed without difficulty.  There was minimal blood loss. There were no complications. A pressure bandage was applied to reduce any bruising.  The patient tolerated the procedure well and was given post procedure instructions.  Patient is planning to use nothing for contraception. ? ?Clarnce Flock, MD/MPH ?Family Medicine, Faculty Practice ?[facility] ? ? ?Of note, patient hypertensive on multiple checks and on review of chart she has been hypertensive for many years. She also reports HTN issues with first pregnancy. We discussed increased lifetime risk of essential HTN with patient's who have had PIH. Given history I recommended starting BP medication and she was amenable, rx sent for amlodipine 5 mg. Strongly urged her to connect with PCP asap for ongoing management of this issue.  ?

## 2021-11-13 NOTE — Patient Instructions (Signed)
I have prescribed you a medicine for your high blood pressure called amlodipine. Take it once daily and schedule an appointment with a primary care doctor as soon as possible.  ?

## 2021-11-29 DIAGNOSIS — Z419 Encounter for procedure for purposes other than remedying health state, unspecified: Secondary | ICD-10-CM | POA: Diagnosis not present

## 2021-12-29 DIAGNOSIS — Z419 Encounter for procedure for purposes other than remedying health state, unspecified: Secondary | ICD-10-CM | POA: Diagnosis not present

## 2022-01-23 ENCOUNTER — Ambulatory Visit: Payer: Medicaid Other | Admitting: Family Medicine

## 2022-01-25 ENCOUNTER — Ambulatory Visit (HOSPITAL_COMMUNITY)
Admission: EM | Admit: 2022-01-25 | Discharge: 2022-01-25 | Disposition: A | Discharge status: Home / Self Care | Payer: Medicaid Other | Attending: Family Medicine | Admitting: Family Medicine

## 2022-01-25 ENCOUNTER — Ambulatory Visit (HOSPITAL_COMMUNITY): Payer: Medicaid Other

## 2022-01-25 ENCOUNTER — Ambulatory Visit (INDEPENDENT_AMBULATORY_CARE_PROVIDER_SITE_OTHER): Payer: Medicaid Other

## 2022-01-25 DIAGNOSIS — M7751 Other enthesopathy of right foot: Secondary | ICD-10-CM

## 2022-01-25 DIAGNOSIS — M25571 Pain in right ankle and joints of right foot: Secondary | ICD-10-CM

## 2022-01-25 MED ORDER — NAPROXEN 375 MG PO TABS: 375.0000 mg | ORAL_TABLET | Freq: Two times a day (BID) | ORAL | 0 refills | Status: AC

## 2022-01-25 NOTE — Discharge Instructions (Signed)
Apply ankle lace up shoe with all ambulatory activities.  Use crutches to prevent weightbearing over the weekend.  If your foot remains swollen and painful by Monday, follow-up at [facility] for further work-up and evaluation for possible ligament injury.  Recommend icing as much as tolerated over the weekend to decrease swelling.  I have prescribed naproxen to take 1 tablet twice daily for the next 5 to 7 days for pain and to decrease inflammation related to your ankle injury.

## 2022-01-25 NOTE — ED Triage Notes (Signed)
Pt reports left ankle pain after hearing popping sound last night.

## 2022-01-25 NOTE — ED Provider Notes (Signed)
[facility]    CSN: [id number] Arrival date & time: 01/25/22  1225      History   Chief Complaint Chief Complaint  Patient presents with   Ankle Pain    HPI Gloria Reilly is a 26 y.o. female.   HPI Patient presents for evaluation of right ankle injury.  Patient reports she was walking in the dark at home last night subsequently failed and twisted her right ankle.  She endorses feeling the sensation that her ankle popped and has had severe swelling and pain with weightbearing since injury occurred.  She has applied ice for short period of time.  She has not taken any medication for pain.  Past Medical History:  Diagnosis Date   Hypertension 02/28/2013   Normal labor 02/14/2013   Postpartum care following vaginal delivery (8/17) 02/14/2013    Patient Active Problem List   Diagnosis Date Noted   Anemia 03/06/2013   Hypertension 02/28/2013    No past surgical history on file.  OB History     Gravida  1   Para  1   Term  1   Preterm      AB      Living  1      SAB      IAB      Ectopic      Multiple      Live Births  1            Home Medications    Prior to Admission medications   Medication Sig Start Date End Date Taking? Authorizing Provider  naproxen (NAPROSYN) 375 MG tablet Take 1 tablet (375 mg total) by mouth 2 (two) times daily for 7 days. 01/25/22 02/01/22 Yes Bing Neighbors, FNP  amLODipine (NORVASC) 5 MG tablet Take 1 tablet (5 mg total) by mouth daily. 11/13/21   Venora Maples, MD    Family History Family History  Problem Relation Age of Onset   Stroke Maternal Grandmother    Diabetes Maternal Grandmother    Diabetes Maternal Grandfather     Social History Social History   Tobacco Use   Smoking status: Never   Smokeless tobacco: Never  Substance Use Topics   Alcohol use: Yes    Comment: occasional    Drug use: Yes    Types: Marijuana    Comment: daily     Allergies   Patient has no known  allergies.   Review of Systems Review of Systems   Physical Exam Triage Vital Signs ED Triage Vitals  Enc Vitals Group     BP 01/25/22 1317 (!) 141/96     Pulse Rate 01/25/22 1317 66     Resp 01/25/22 1317 16     Temp 01/25/22 1317 98.2 F (36.8 C)     Temp Source 01/25/22 1317 Oral     SpO2 01/25/22 1317 99 %     Weight --      Height --      Head Circumference --      Peak Flow --      Pain Score 01/25/22 1320 9     Pain Loc --      Pain Edu? --      Excl. in GC? --    No data found.  Updated Vital Signs BP (!) 141/96 (BP Location: Left Arm)   Pulse 66   Temp 98.2 F (36.8 C) (Oral)   Resp 16   SpO2 99%   Visual Acuity Right  Eye Distance:   Left Eye Distance:   Bilateral Distance:    Right Eye Near:   Left Eye Near:    Bilateral Near:     Physical Exam Vitals reviewed.  Constitutional:      Appearance: Normal appearance.  HENT:     Head: Normocephalic and atraumatic.  Eyes:     Extraocular Movements: Extraocular movements intact.     Pupils: Pupils are equal, round, and reactive to light.  Cardiovascular:     Rate and Rhythm: Normal rate and regular rhythm.  Pulmonary:     Effort: Pulmonary effort is normal.     Breath sounds: Normal breath sounds.  Musculoskeletal:     Cervical back: Normal range of motion and neck supple.     Right ankle: Swelling present. Tenderness present over the medial malleolus and CF ligament. Decreased range of motion.  Skin:    General: Skin is warm and dry.     Capillary Refill: Capillary refill takes less than 2 seconds.  Neurological:     General: No focal deficit present.     Mental Status: She is alert and oriented to person, place, and time.  Psychiatric:        Mood and Affect: Mood normal.        Behavior: Behavior normal.        Thought Content: Thought content normal.        Judgment: Judgment normal.      UC Treatments / Results  Labs (all labs ordered are listed, but only abnormal results are  displayed) Labs Reviewed - No data to display  EKG   Radiology No results found.  Procedures Procedures (including critical care time)  Medications Ordered in UC Medications - No data to display  Initial Impression / Assessment and Plan / UC Course  I have reviewed the triage vital signs and the nursing notes.  Pertinent labs & imaging results that were available during my care of the patient were reviewed by me and considered in my medical decision making (see chart for details).    Imaging negative for acute fracture although revealed a bone spur. However unable to rule out ligament injury, recommended RICE and antiinflammatories. Ankle lace up applied to right ankle. Orthopedics follow-up instructions provided if symptoms do not readily improve with prescribed treatment. Final Clinical Impressions(s) / UC Diagnoses   Final diagnoses:  Acute right ankle pain  Bone spur of right ankle     Discharge Instructions      Apply ankle lace up shoe with all ambulatory activities.  Use crutches to prevent weightbearing over the weekend.  If your foot remains swollen and painful by Monday, follow-up at [facility] for further work-up and evaluation for possible ligament injury.  Recommend icing as much as tolerated over the weekend to decrease swelling.  I have prescribed naproxen to take 1 tablet twice daily for the next 5 to 7 days for pain and to decrease inflammation related to your ankle injury.       ED Prescriptions     Medication Sig Dispense Auth. Provider   naproxen (NAPROSYN) 375 MG tablet Take 1 tablet (375 mg total) by mouth 2 (two) times daily for 7 days. 14 tablet Bing Neighbors, FNP      PDMP not reviewed this encounter.   Bing Neighbors, FNP 02/01/22 1818

## 2022-01-29 DIAGNOSIS — Z419 Encounter for procedure for purposes other than remedying health state, unspecified: Secondary | ICD-10-CM | POA: Diagnosis not present

## 2022-02-13 ENCOUNTER — Other Ambulatory Visit: Payer: Self-pay

## 2022-02-13 ENCOUNTER — Encounter (HOSPITAL_BASED_OUTPATIENT_CLINIC_OR_DEPARTMENT_OTHER): Payer: Self-pay | Admitting: Emergency Medicine

## 2022-02-13 ENCOUNTER — Emergency Department (HOSPITAL_BASED_OUTPATIENT_CLINIC_OR_DEPARTMENT_OTHER)
Admission: EM | Admit: 2022-02-13 | Discharge: 2022-02-13 | Disposition: A | Discharge status: Home / Self Care | Payer: Medicaid Other | Attending: Emergency Medicine | Admitting: Emergency Medicine

## 2022-02-13 DIAGNOSIS — R300 Dysuria: Secondary | ICD-10-CM | POA: Diagnosis present

## 2022-02-13 DIAGNOSIS — N390 Urinary tract infection, site not specified: Secondary | ICD-10-CM | POA: Insufficient documentation

## 2022-02-13 DIAGNOSIS — J020 Streptococcal pharyngitis: Secondary | ICD-10-CM | POA: Diagnosis not present

## 2022-02-13 DIAGNOSIS — Z20818 Contact with and (suspected) exposure to other bacterial communicable diseases: Secondary | ICD-10-CM | POA: Diagnosis not present

## 2022-02-13 DIAGNOSIS — J029 Acute pharyngitis, unspecified: Secondary | ICD-10-CM | POA: Insufficient documentation

## 2022-02-13 LAB — URINALYSIS, MICROSCOPIC (REFLEX)

## 2022-02-13 LAB — URINALYSIS, ROUTINE W REFLEX MICROSCOPIC
Bilirubin Urine: NEGATIVE
Glucose, UA: NEGATIVE mg/dL
Ketones, ur: NEGATIVE mg/dL
Nitrite: NEGATIVE
Protein, ur: 30 mg/dL — AB
Specific Gravity, Urine: 1.02 (ref 1.005–1.030)
pH: 7 (ref 5.0–8.0)

## 2022-02-13 LAB — PREGNANCY, URINE: Preg Test, Ur: NEGATIVE

## 2022-02-13 MED ORDER — CEPHALEXIN 500 MG PO CAPS: 500.0000 mg | ORAL_CAPSULE | Freq: Two times a day (BID) | ORAL | 0 refills | Status: AC

## 2022-02-13 NOTE — Discharge Instructions (Signed)
Follow-up in your MyChart account for your gonorrhea and Chlamydia testing results.  If either of these are positive, follow-up with your primary care provider or the health department for further treatment. Take antibiotics as prescribed and complete the full course.  This covers your urine infection and possible strep throat.

## 2022-02-13 NOTE — ED Provider Notes (Signed)
[facility] EMERGENCY DEPARTMENT Provider Note   CSN: [id number] Arrival date & time: 02/13/22  1059     History  Chief Complaint  Patient presents with   Dysuria    Gloria Reilly is a 26 y.o. female.  26 year old female presents with concern for scratchy throat (here with her child who has tested positive for strep), as well as urinary frequency and bladder pressure, discomfort.  Also reports that she has some vaginal irritation from her tampons and is concerned for STD.  Denies fevers, chills, vomiting.       Home Medications Prior to Admission medications   Medication Sig Start Date End Date Taking? Authorizing Provider  cephALEXin (KEFLEX) 500 MG capsule Take 1 capsule (500 mg total) by mouth 2 (two) times daily for 10 days. 02/13/22 02/23/22 Yes Jeannie Fend, PA-C  amLODipine (NORVASC) 5 MG tablet Take 1 tablet (5 mg total) by mouth daily. 11/13/21   Venora Maples, MD      Allergies    Patient has no known allergies.    Review of Systems   Review of Systems Negative except as per HPI Physical Exam Updated Vital Signs BP (!) 157/107 (BP Location: Right Arm)   Pulse 71   Temp 98.7 F (37.1 C) (Oral)   Resp 20   Ht 5\' 6"  (1.676 m)   Wt 74.8 kg   LMP 02/11/2022   SpO2 99%   BMI 26.63 kg/m  Physical Exam Vitals and nursing note reviewed.  Constitutional:      General: She is not in acute distress.    Appearance: She is well-developed. She is not diaphoretic.  HENT:     Head: Normocephalic and atraumatic.     Right Ear: Tympanic membrane and ear canal normal.     Left Ear: Tympanic membrane and ear canal normal.     Nose: No congestion.     Mouth/Throat:     Mouth: Mucous membranes are moist.     Pharynx: Posterior oropharyngeal erythema present. No oropharyngeal exudate.  Eyes:     Conjunctiva/sclera: Conjunctivae normal.  Cardiovascular:     Rate and Rhythm: Normal rate and regular rhythm.     Heart sounds: Normal heart sounds.   Pulmonary:     Effort: Pulmonary effort is normal.     Breath sounds: Normal breath sounds.  Abdominal:     Palpations: Abdomen is soft.     Tenderness: There is no right CVA tenderness or left CVA tenderness.  Musculoskeletal:     Cervical back: Neck supple.  Lymphadenopathy:     Cervical: No cervical adenopathy.  Skin:    General: Skin is warm and dry.     Findings: No erythema or rash.  Neurological:     Mental Status: She is alert and oriented to person, place, and time.  Psychiatric:        Behavior: Behavior normal.     ED Results / Procedures / Treatments   Labs (all labs ordered are listed, but only abnormal results are displayed) Labs Reviewed  URINALYSIS, ROUTINE W REFLEX MICROSCOPIC - Abnormal; Notable for the following components:      Result Value   Hgb urine dipstick MODERATE (*)    Protein, ur 30 (*)    Leukocytes,Ua SMALL (*)    All other components within normal limits  URINALYSIS, MICROSCOPIC (REFLEX) - Abnormal; Notable for the following components:   Bacteria, UA FEW (*)    All other components within normal limits  PREGNANCY,  URINE  GC/CHLAMYDIA PROBE AMP ([place]) NOT AT [facility]    EKG None  Radiology No results found.  Procedures Procedures    Medications Ordered in ED Medications - No data to display  ED Course/ Medical Decision Making/ A&P                           Medical Decision Making Amount and/or Complexity of Data Reviewed Labs: ordered.  Risk Prescription drug management.   26 year old female with concern for UTI versus STD and sore throat after exposure to her child who has tested positive for strep today.  She is well-appearing on exam, does have mild erythema to the posterior oropharynx without tender cervical lymphadenopathy.  Her abdomen is soft and nontender, no CVA tenderness, no reports of pelvic pain or fever. Urinalysis with possible UTI with moderate hemoglobin, protein, small leukocytes with few bacteria,  not contaminated.  Pregnancy test is negative.  Add on gonorrhea chlamydia test, advised to follow-up in her MyChart for this with follow-up with PCP or health department if treatment is necessary. We will treat her UTI with Keflex, extend the course for strep coverage as her child has tested positive for strep and she has a sore throat today as well.        Final Clinical Impression(s) / ED Diagnoses Final diagnoses:  Urinary tract infection in female  Sore throat  Exposure to strep throat    Rx / DC Orders ED Discharge Orders          Ordered    cephALEXin (KEFLEX) 500 MG capsule  2 times daily        02/13/22 1228              Jeannie Fend, PA-C 02/13/22 1233    Mardene Sayer, MD 02/13/22 1550

## 2022-02-13 NOTE — ED Triage Notes (Signed)
Pt reports "cramping when I pee"; wants to be checked for STDs as well

## 2022-02-14 LAB — GC/CHLAMYDIA PROBE AMP (~~LOC~~) NOT AT ARMC
Chlamydia: POSITIVE — AB
Comment: NEGATIVE
Comment: NORMAL
Neisseria Gonorrhea: POSITIVE — AB

## 2022-02-15 ENCOUNTER — Emergency Department (HOSPITAL_BASED_OUTPATIENT_CLINIC_OR_DEPARTMENT_OTHER)
Admission: EM | Admit: 2022-02-15 | Discharge: 2022-02-15 | Disposition: A | Discharge status: Home / Self Care | Payer: Medicaid Other | Attending: Emergency Medicine | Admitting: Emergency Medicine

## 2022-02-15 ENCOUNTER — Encounter (HOSPITAL_BASED_OUTPATIENT_CLINIC_OR_DEPARTMENT_OTHER): Payer: Self-pay | Admitting: Emergency Medicine

## 2022-02-15 ENCOUNTER — Other Ambulatory Visit: Payer: Self-pay

## 2022-02-15 DIAGNOSIS — R300 Dysuria: Secondary | ICD-10-CM | POA: Diagnosis present

## 2022-02-15 DIAGNOSIS — A749 Chlamydial infection, unspecified: Secondary | ICD-10-CM | POA: Diagnosis not present

## 2022-02-15 DIAGNOSIS — A549 Gonococcal infection, unspecified: Secondary | ICD-10-CM | POA: Insufficient documentation

## 2022-02-15 MED ORDER — DOXYCYCLINE HYCLATE 100 MG PO CAPS: 100.0000 mg | ORAL_CAPSULE | Freq: Two times a day (BID) | ORAL | 0 refills | Status: AC

## 2022-02-15 MED ORDER — DOXYCYCLINE HYCLATE 100 MG PO TABS
100.0000 mg | ORAL_TABLET | Freq: Once | ORAL | Status: AC
  Administered 2022-02-15: 100 mg via ORAL
  Filled 2022-02-15: qty 1

## 2022-02-15 MED ORDER — CEFTRIAXONE SODIUM 500 MG IJ SOLR
500.0000 mg | Freq: Once | INTRAMUSCULAR | Status: AC
  Administered 2022-02-15: 500 mg via INTRAMUSCULAR
  Filled 2022-02-15: qty 500

## 2022-02-15 NOTE — Discharge Instructions (Signed)
Tested positive for both chlamydia and gonorrhea.  I have given you 1 shot of Rocephin to treat the gonorrhea.  You will also be on doxycycline for 7 days twice a day to treat the chlamydia.  Please take this as prescribed.  If you develop continued or worsening symptoms such as pelvic pain, or fevers, please return to the emergency department.

## 2022-02-15 NOTE — ED Provider Notes (Signed)
[facility] EM[facility]ENC[facility]PARTMENT Provider Note   CSN: [id number] Arrival date & time: 02/15/22  9030     History PMH: HTN Chief Complaint  Patient presents with   Exposure to STD    Gloria Reilly is a 26 y.o. female. Presents to the ED after a positive gonorrhea and chlamydia test.  She reports that she was seen here 2 days ago after having dysuria, vaginal irritation, and sore throat.  She had a urinalysis done that was concerning for urinary tract infection was started on Keflex.  She was not tested for strep throat at that time, however her sore throat has gotten better after being on the Keflex.  She was not treated prophylactically for STDs at that time.  She says her vaginal symptoms have continued to be persistent, however she is on her menstrual cycle so she has not really noticed any abnormal discharge.  She denies any pelvic pain, fevers, chills, flank pain, nausea, vomiting, diarrhea, or constipation.   Exposure to STD Pertinent negatives include no abdominal pain.       Home Medications Prior to Admission medications   Medication Sig Start Date End Date Taking? Authorizing Provider  amLODipine (NORVASC) 5 MG tablet Take 1 tablet (5 mg total) by mouth daily. 11/13/21   Venora Maples, MD  cephALEXin (KEFLEX) 500 MG capsule Take 1 capsule (500 mg total) by mouth 2 (two) times daily for 10 days. 02/13/22 02/23/22  Jeannie Fend, PA-C      Allergies    Patient has no known allergies.    Review of Systems   Review of Systems  Constitutional:  Negative for fever.  Gastrointestinal:  Negative for abdominal pain, nausea and vomiting.  Genitourinary:  Positive for vaginal pain. Negative for dysuria, flank pain, hematuria, menstrual problem, pelvic pain and vaginal discharge.  All other systems reviewed and are negative.   Physical Exam Updated Vital Signs BP (!) 175/119   Pulse 67   Temp 98.6 F (37 C) (Oral)   Resp 18   Ht 5\' 6"  (1.676 m)   Wt 74.8  kg   LMP 02/11/2022   SpO2 99%   BMI 26.63 kg/m  Physical Exam Vitals and nursing note reviewed.  Constitutional:      General: She is not in acute distress.    Appearance: Normal appearance. She is well-developed. She is not ill-appearing, toxic-appearing or diaphoretic.  HENT:     Head: Normocephalic and atraumatic.     Nose: No nasal deformity.     Mouth/Throat:     Lips: Pink. No lesions.  Eyes:     General: Gaze aligned appropriately. No scleral icterus.       Right eye: No discharge.        Left eye: No discharge.     Conjunctiva/sclera: Conjunctivae normal.     Right eye: Right conjunctiva is not injected. No exudate or hemorrhage.    Left eye: Left conjunctiva is not injected. No exudate or hemorrhage. Pulmonary:     Effort: Pulmonary effort is normal. No respiratory distress.  Abdominal:     General: Abdomen is flat. There is no distension.     Palpations: Abdomen is soft.     Tenderness: There is no abdominal tenderness. There is no right CVA tenderness, left CVA tenderness, guarding or rebound.  Skin:    General: Skin is warm and dry.  Neurological:     Mental Status: She is alert and oriented to person, place, and time.  Psychiatric:        Mood and Affect: Mood normal.        Speech: Speech normal.        Behavior: Behavior normal. Behavior is cooperative.     ED Results / Procedures / Treatments   Labs (all labs ordered are listed, but only abnormal results are displayed) Labs Reviewed - No data to display  EKG None  Radiology No results found.  Procedures Procedures   Medications Ordered in ED Medications  cefTRIAXone (ROCEPHIN) injection 500 mg (has no administration in time range)  doxycycline (VIBRA-TABS) tablet 100 mg (has no administration in time range)    ED Course/ Medical Decision Making/ A&P                           Medical Decision Making Risk Prescription drug management.   Patient presents after testing positive for both  gonorrhea and chlamydia.  She has had several days of vaginal irritation while being on her menstrual cycle.  She has been on Keflex for a urinary tract infection.  She is presenting here for treatment of her STDs.  She has no symptoms to suggest PID, or systemic infection.  Her sore throat and urinary symptoms have started to improve.  She is not pregnant based on pregnancy test two days ago. No further testing is indicated at this time.  We will defer pelvic exam at this time. Patient defers further STD testing. Plan to treat her with Rocephin IM and doxycycline for STDs.  She is told to inform any sexual partners over the last 3 months of diagnosis and need to be tested.  She needs to refrain from sexual activity for at least 2 weeks while being treated to to prevent reinfection.  Final Clinical Impression(s) / ED Diagnoses Final diagnoses:  Gonorrhea  Chlamydia    Rx / DC Orders ED Discharge Orders     None         Claudie Leach, PA-C 02/15/22 0959    Rondel Baton, MD 02/15/22 1756

## 2022-02-15 NOTE — ED Triage Notes (Signed)
Patient presents to ED via POV from home. Reports she was here 2 days for STD check. Results came back positive. Here for treatment.

## 2022-03-01 DIAGNOSIS — Z419 Encounter for procedure for purposes other than remedying health state, unspecified: Secondary | ICD-10-CM | POA: Diagnosis not present

## 2022-03-19 ENCOUNTER — Other Ambulatory Visit: Payer: Self-pay

## 2022-03-19 ENCOUNTER — Encounter: Payer: Self-pay | Admitting: Family Medicine

## 2022-03-19 ENCOUNTER — Other Ambulatory Visit (HOSPITAL_COMMUNITY)
Admission: RE | Admit: 2022-03-19 | Discharge: 2022-03-19 | Disposition: A | Discharge status: Home / Self Care | Payer: Medicaid Other | Source: Ambulatory Visit | Attending: Family Medicine | Admitting: Family Medicine

## 2022-03-19 ENCOUNTER — Ambulatory Visit (INDEPENDENT_AMBULATORY_CARE_PROVIDER_SITE_OTHER): Payer: Medicaid Other | Admitting: Advanced Practice Midwife

## 2022-03-19 VITALS — BP 150/103 | HR 89 | Ht 66.0 in | Wt 163.9 lb

## 2022-03-19 DIAGNOSIS — B3731 Acute candidiasis of vulva and vagina: Secondary | ICD-10-CM

## 2022-03-19 DIAGNOSIS — R350 Frequency of micturition: Secondary | ICD-10-CM

## 2022-03-19 DIAGNOSIS — Z113 Encounter for screening for infections with a predominantly sexual mode of transmission: Secondary | ICD-10-CM | POA: Diagnosis not present

## 2022-03-19 DIAGNOSIS — A749 Chlamydial infection, unspecified: Secondary | ICD-10-CM

## 2022-03-19 DIAGNOSIS — Z01419 Encounter for gynecological examination (general) (routine) without abnormal findings: Secondary | ICD-10-CM | POA: Diagnosis not present

## 2022-03-19 DIAGNOSIS — N760 Acute vaginitis: Secondary | ICD-10-CM | POA: Diagnosis not present

## 2022-03-19 DIAGNOSIS — A549 Gonococcal infection, unspecified: Secondary | ICD-10-CM

## 2022-03-19 DIAGNOSIS — Z7689 Persons encountering health services in other specified circumstances: Secondary | ICD-10-CM

## 2022-03-19 DIAGNOSIS — I10 Essential (primary) hypertension: Secondary | ICD-10-CM

## 2022-03-19 DIAGNOSIS — B9689 Other specified bacterial agents as the cause of diseases classified elsewhere: Secondary | ICD-10-CM

## 2022-03-19 NOTE — Progress Notes (Unsigned)
  Subjective:     Patient ID: Gloria Reilly, female   DOB: 04-16-1996, 26 y.o.   MRN: [id number]  HPI   Review of Systems     Objective:   Physical Exam     Assessment:     ***    Plan:     ***

## 2022-03-20 ENCOUNTER — Encounter: Payer: Self-pay | Admitting: Advanced Practice Midwife

## 2022-03-20 DIAGNOSIS — A549 Gonococcal infection, unspecified: Secondary | ICD-10-CM

## 2022-03-20 DIAGNOSIS — A749 Chlamydial infection, unspecified: Secondary | ICD-10-CM

## 2022-03-20 HISTORY — DX: Gonococcal infection, unspecified: A54.9

## 2022-03-20 HISTORY — DX: Chlamydial infection, unspecified: A74.9

## 2022-03-20 LAB — CERVICOVAGINAL ANCILLARY ONLY
Bacterial Vaginitis (gardnerella): POSITIVE — AB
Candida Glabrata: NEGATIVE
Candida Vaginitis: POSITIVE — AB
Comment: NEGATIVE
Comment: NEGATIVE
Comment: NEGATIVE

## 2022-03-20 MED ORDER — METRONIDAZOLE 500 MG PO TABS: 500.0000 mg | ORAL_TABLET | Freq: Two times a day (BID) | ORAL | 0 refills | Status: DC

## 2022-03-20 MED ORDER — FLUCONAZOLE 150 MG PO TABS: ORAL_TABLET | ORAL | 2 refills | Status: DC

## 2022-03-20 MED ORDER — DOXYCYCLINE HYCLATE 100 MG PO CAPS: 100.0000 mg | ORAL_CAPSULE | Freq: Two times a day (BID) | ORAL | 0 refills | Status: DC

## 2022-03-20 MED ORDER — AMLODIPINE BESYLATE 5 MG PO TABS: 5.0000 mg | ORAL_TABLET | Freq: Every day | ORAL | 3 refills | Status: AC

## 2022-03-20 NOTE — Progress Notes (Signed)
You tested positive for Bacterial Vaginosis and Yeast Infection. I have prescribed Flagyl for BV and Diflucan for yeast infection (with refills in case the antibiotics cause the yeast infection to return).

## 2022-03-20 NOTE — Progress Notes (Signed)
GYNECOLOGY ANNUAL PREVENTATIVE CARE ENCOUNTER NOTE  History:     Contina Strain is a 26 y.o. G3P1001 female here for a routine annual gynecologic exam.  Current complaints:urinary frequency. Requests STI testing and Pap, but declines blood tests for Syphilis, HIV and Hepatitis. Was seen I nthe ED in [place] and tested pos for GC and Chlamydia. ABX given.  Denies abnormal vaginal bleeding, discharge, pelvic pain, problems with intercourse or other gynecologic concerns.    Gynecologic History Patient's last menstrual period was 03/10/2022 (exact date). Contraception: condoms Last Pap: not on file.  Last mammogram: NA, not indicated.   Hx HTN. Has Lisinopril Rx but is not taking. Does not have PCP.   Obstetric History OB History  Gravida Para Term Preterm AB Living  1 1 1     1   SAB IAB Ectopic Multiple Live Births          1    # Outcome Date GA Lbr Len/2nd Weight Sex Delivery Anes PTL Lv  1 Term 02/14/13 [redacted]w[redacted]d 13:08 / 01:44 7 lb 1.9 oz (3.229 kg) M Vag-Spont EPI  LIV     Birth Comments: WNL    Past Medical History:  Diagnosis Date   Hypertension 02/28/2013   Normal labor 02/14/2013   Postpartum care following vaginal delivery (8/17) 02/14/2013    No past surgical history on file.  Current Outpatient Medications on File Prior to Visit  Medication Sig Dispense Refill   amLODipine (NORVASC) 5 MG tablet Take 1 tablet (5 mg total) by mouth daily. (Patient not taking: Reported on 03/19/2022) 90 tablet 3   No current facility-administered medications on file prior to visit.    No Known Allergies  Social History:  reports that she has never smoked. She has never used smokeless tobacco. She reports current alcohol use. She reports current drug use. Drug: Marijuana.  Family History  Problem Relation Age of Onset   Stroke Maternal Grandmother    Diabetes Maternal Grandmother    Diabetes Maternal Grandfather     The following portions of the patient's history were reviewed and  updated as appropriate: allergies, current medications, past family history, past medical history, past social history, past surgical history and problem list.  Review of Systems Review of Systems  Constitutional:  Negative for chills and fever.  Gastrointestinal:  Negative for abdominal pain.  Genitourinary:  Positive for frequency. Negative for dyspareunia, dysuria, flank pain, hematuria, menstrual problem, urgency, vaginal bleeding and vaginal discharge.      Physical Exam:  BP (!) 150/103   Pulse 89   Ht 5\' 6"  (1.676 m)   Wt 163 lb 14.4 oz (74.3 kg)   LMP 03/10/2022 (Exact Date)   BMI 26.45 kg/m  CONSTITUTIONAL: Well-developed, well-nourished female in no acute distress.  HENT:  Normocephalic, atraumatic, Oropharynx is clear and moist EYES: Conjunctivae normal. No scleral icterus.  NECK: Normal range of motion, supple, no masses.  Normal thyroid.  SKIN: Skin is warm and dry. No rash noted. Not diaphoretic. No erythema. No pallor. MUSCULOSKELETAL: Normal range of motion. No tenderness.  No cyanosis, clubbing, or edema.   NEUROLOGIC: Alert and oriented to person, place, and time. Normal muscle tone coordination.  PSYCHIATRIC: Normal mood and affect. Normal behavior. Normal judgment and thought content. CARDIOVASCULAR: Normal heart rate noted. RESPIRATORY: Effort and rate normal, no problems with respiration noted. BREASTS: declined. ABDOMEN: Soft, normal bowel sounds, no distention noted.  No tenderness, rebound or guarding.  PELVIC: Normal appearing external genitalia; normal appearing vaginal  mucosa and cervix.  Moderate amount of this, whitish-yellow, mildly malodorous discharge noted.  Pap smear obtained.  Normal uterine size, no other palpable masses, no uterine or adnexal tenderness.   Assessment and Plan:    1. Encounter for well woman exam  - Cytology - PAP( [place])  2. Urinary frequency  - Urine Culture  3. Screening examination for STD (sexually transmitted  disease)  - Cervicovaginal ancillary only( [place])  4. Establishing care with new doctor, encounter for--HTN  - Ambulatory referral to [facility]  5. Essential hypertension - referred to PCP - Resent Lisinopril Rx. Emphasized importance of managing HTN and risks of uncontrolled HTN.   Will follow up results of pap smear and manage accordingly. Offered RPR, HIV, Hep testing, declined Mammogram not indicated Routine preventative health maintenance measures emphasized. Please refer to After Visit Summary for other counseling recommendations.      Dorathy Kinsman, CNM [facility] [employer], [facility]

## 2022-03-21 ENCOUNTER — Telehealth: Payer: Self-pay

## 2022-03-21 LAB — CYTOLOGY - PAP
Chlamydia: NEGATIVE
Comment: NEGATIVE
Comment: NEGATIVE
Comment: NEGATIVE
Comment: NORMAL
Diagnosis: NEGATIVE
High risk HPV: POSITIVE — AB
Neisseria Gonorrhea: NEGATIVE
Trichomonas: POSITIVE — AB

## 2022-03-21 LAB — URINE CULTURE

## 2022-03-21 NOTE — Telephone Encounter (Signed)
Call placed to pt. Spoke with pt. Pt given results and recommendations per [place], [place]. Pt does not have Gonorrhea or Chlamydia at this time. It was treated by her PCP in Aug from 8/16 results. Pt now has trich, BV and yeast and was made aware of these today per pap and swab from 03/19/22.This was verified by another RN in office.   Pt advised of new Rx for treatment and given guidelines for partner to be treated. Pt verbalized understanding and agreeable to plan of care. Colletta Maryland, RNC

## 2022-03-21 NOTE — Telephone Encounter (Signed)
-----   Message from [place], [place] sent at 03/20/2022 11:53 PM EDT ----- You tested positive Gonorrhea, Chlamydia, Bacterial Vaginosis and Yeast Infection. You will need to get an injection of Rocephin to treat Gonorrhea.  I have prescribed Doxycycline for Chlamydia, Flagyl for BV and Diflucan for yeast infection (with refills in case the antibiotics cause the yeast infection to return).  You partner needs to be treated for Gonorrhea and Chlamydia. They can go to their doctor or the Health Department. We have to report Gonorrhea and Chlamydia infections to the Health Department. Do not have sex for at least 1 week after both you and your partner have been treated and use condoms consistently to avoid reinfection. I also recommend blood tests for Syphilis, HIV and Hepatitis. Marland Kitchen

## 2022-03-25 ENCOUNTER — Encounter: Payer: Self-pay | Admitting: Advanced Practice Midwife

## 2022-03-31 DIAGNOSIS — Z419 Encounter for procedure for purposes other than remedying health state, unspecified: Secondary | ICD-10-CM | POA: Diagnosis not present

## 2022-04-26 ENCOUNTER — Encounter (HOSPITAL_BASED_OUTPATIENT_CLINIC_OR_DEPARTMENT_OTHER): Payer: Self-pay | Admitting: *Deleted

## 2022-04-26 ENCOUNTER — Other Ambulatory Visit: Payer: Self-pay

## 2022-04-26 ENCOUNTER — Emergency Department (HOSPITAL_BASED_OUTPATIENT_CLINIC_OR_DEPARTMENT_OTHER)
Admission: EM | Admit: 2022-04-26 | Discharge: 2022-04-26 | Discharge status: Against Medical Advice | Payer: Medicaid Other | Attending: Emergency Medicine | Admitting: Emergency Medicine

## 2022-04-26 DIAGNOSIS — Z5321 Procedure and treatment not carried out due to patient leaving prior to being seen by health care provider: Secondary | ICD-10-CM | POA: Diagnosis not present

## 2022-04-26 DIAGNOSIS — N898 Other specified noninflammatory disorders of vagina: Secondary | ICD-10-CM | POA: Diagnosis not present

## 2022-04-26 LAB — URINALYSIS, MICROSCOPIC (REFLEX)

## 2022-04-26 LAB — URINALYSIS, ROUTINE W REFLEX MICROSCOPIC
Glucose, UA: NEGATIVE mg/dL
Ketones, ur: 15 mg/dL — AB
Leukocytes,Ua: NEGATIVE
Nitrite: NEGATIVE
Protein, ur: 100 mg/dL — AB
Specific Gravity, Urine: >=1.03 (ref 1.005–1.030)
pH: 6 (ref 5.0–8.0)

## 2022-04-26 LAB — PREGNANCY, URINE: Preg Test, Ur: NEGATIVE

## 2022-04-26 NOTE — ED Triage Notes (Signed)
Patient completed her treatment for STD and has ongoing discharge.  She states her partner did not get treated.  He is here to be seen as well.  Patient states she has no odor noted and she denies any pain.

## 2022-04-28 ENCOUNTER — Telehealth (HOSPITAL_BASED_OUTPATIENT_CLINIC_OR_DEPARTMENT_OTHER): Payer: Self-pay | Admitting: Emergency Medicine

## 2022-04-29 ENCOUNTER — Encounter (INDEPENDENT_AMBULATORY_CARE_PROVIDER_SITE_OTHER): Payer: Self-pay | Admitting: Primary Care

## 2022-05-01 DIAGNOSIS — Z419 Encounter for procedure for purposes other than remedying health state, unspecified: Secondary | ICD-10-CM | POA: Diagnosis not present

## 2022-05-31 DIAGNOSIS — Z419 Encounter for procedure for purposes other than remedying health state, unspecified: Secondary | ICD-10-CM | POA: Diagnosis not present

## 2022-07-01 DIAGNOSIS — Z419 Encounter for procedure for purposes other than remedying health state, unspecified: Secondary | ICD-10-CM | POA: Diagnosis not present

## 2022-07-23 DIAGNOSIS — Z114 Encounter for screening for human immunodeficiency virus [HIV]: Secondary | ICD-10-CM | POA: Diagnosis not present

## 2022-07-23 DIAGNOSIS — Z113 Encounter for screening for infections with a predominantly sexual mode of transmission: Secondary | ICD-10-CM | POA: Diagnosis not present

## 2022-07-23 DIAGNOSIS — N760 Acute vaginitis: Secondary | ICD-10-CM | POA: Diagnosis not present

## 2022-07-24 DIAGNOSIS — Z131 Encounter for screening for diabetes mellitus: Secondary | ICD-10-CM | POA: Diagnosis not present

## 2022-07-24 DIAGNOSIS — Z130 Encounter for screening for diseases of the blood and blood-forming organs and certain disorders involving the immune mechanism: Secondary | ICD-10-CM | POA: Diagnosis not present

## 2022-07-24 DIAGNOSIS — Z13220 Encounter for screening for lipoid disorders: Secondary | ICD-10-CM | POA: Diagnosis not present

## 2022-07-24 DIAGNOSIS — I10 Essential (primary) hypertension: Secondary | ICD-10-CM | POA: Diagnosis not present

## 2022-07-24 DIAGNOSIS — K219 Gastro-esophageal reflux disease without esophagitis: Secondary | ICD-10-CM | POA: Diagnosis not present

## 2022-08-01 DIAGNOSIS — Z419 Encounter for procedure for purposes other than remedying health state, unspecified: Secondary | ICD-10-CM | POA: Diagnosis not present

## 2022-08-28 DIAGNOSIS — A5901 Trichomonal vulvovaginitis: Secondary | ICD-10-CM | POA: Diagnosis not present

## 2022-08-28 DIAGNOSIS — Z113 Encounter for screening for infections with a predominantly sexual mode of transmission: Secondary | ICD-10-CM | POA: Diagnosis not present

## 2022-08-30 DIAGNOSIS — Z419 Encounter for procedure for purposes other than remedying health state, unspecified: Secondary | ICD-10-CM | POA: Diagnosis not present

## 2022-09-08 IMAGING — CR DG WRIST COMPLETE 3+V*R*
4 series · 4 of 4 positions shown · non-contrast
Comparison: None.

CLINICAL DATA: Base of thumb pain, wrist swelling

EXAM:
RIGHT WRIST - COMPLETE 3+ VIEW

[x wrist pa right]
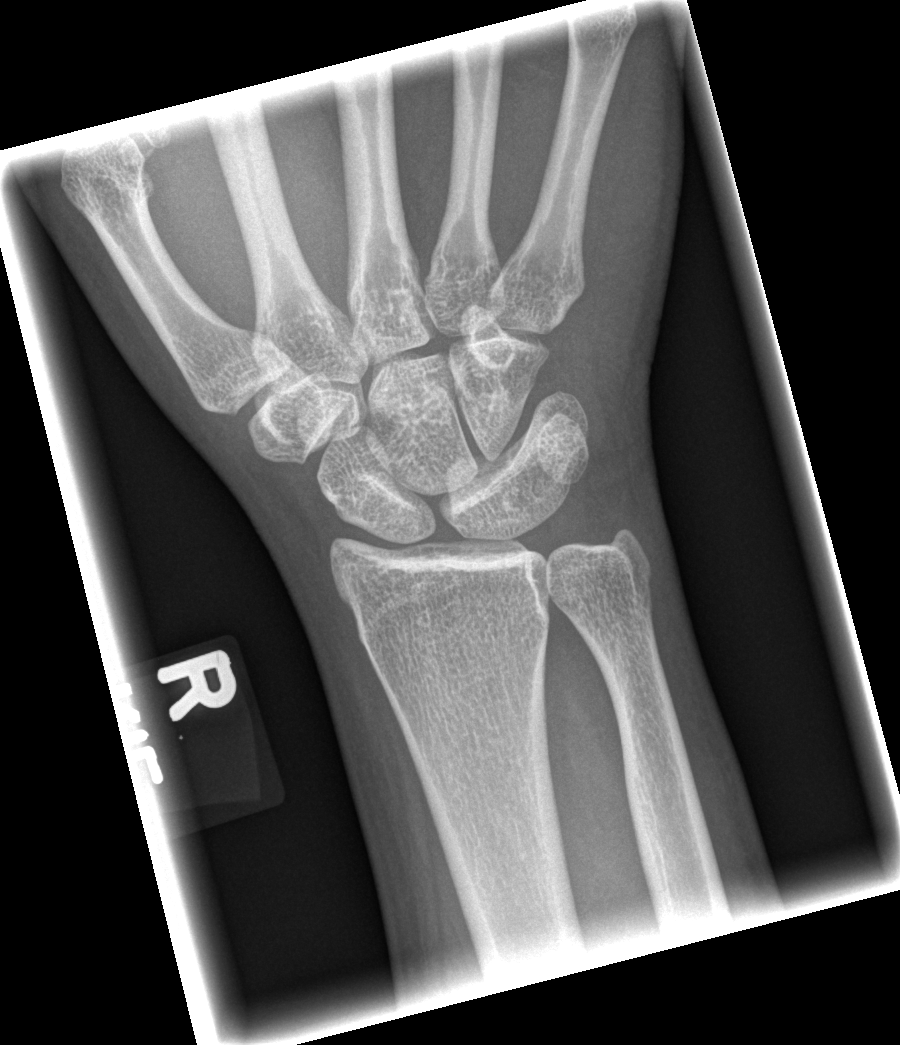

[x wrist obl right]
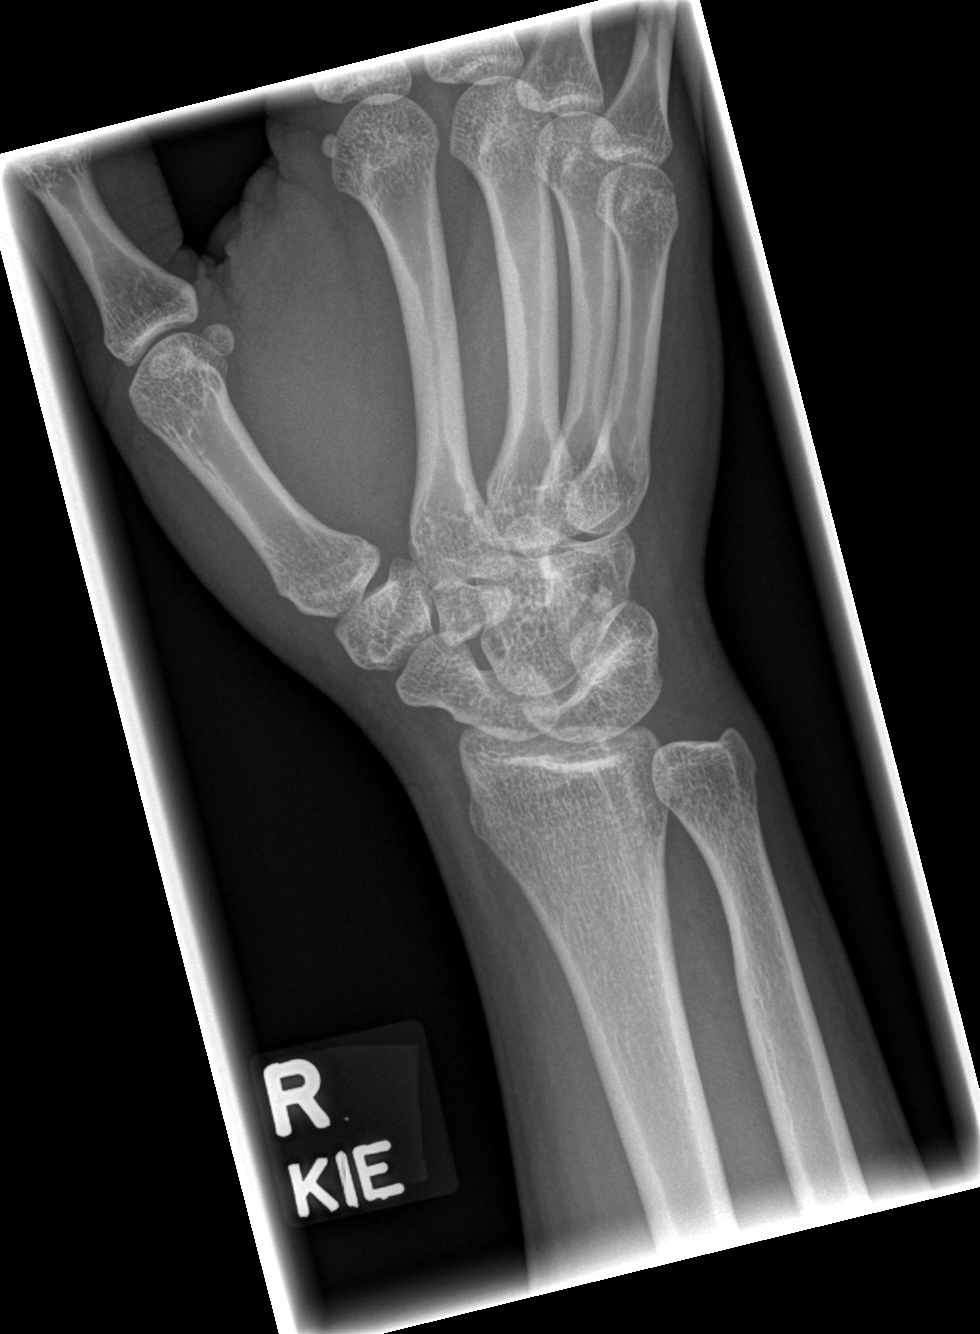

[x wrist lat right]
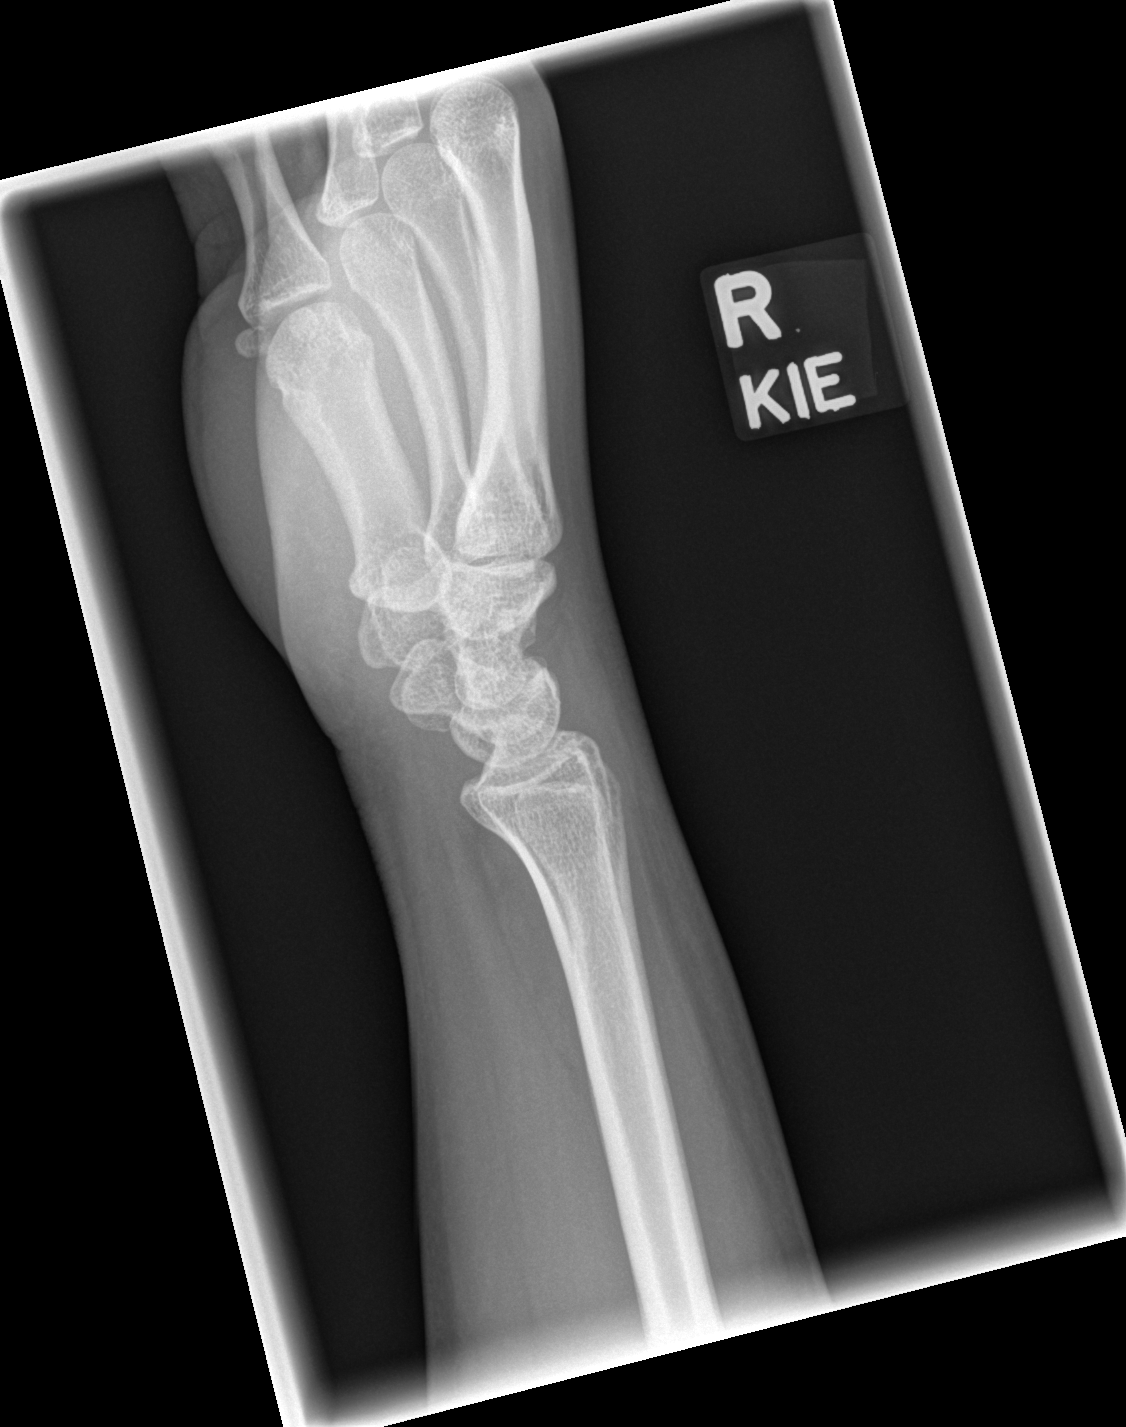

[x navicular]
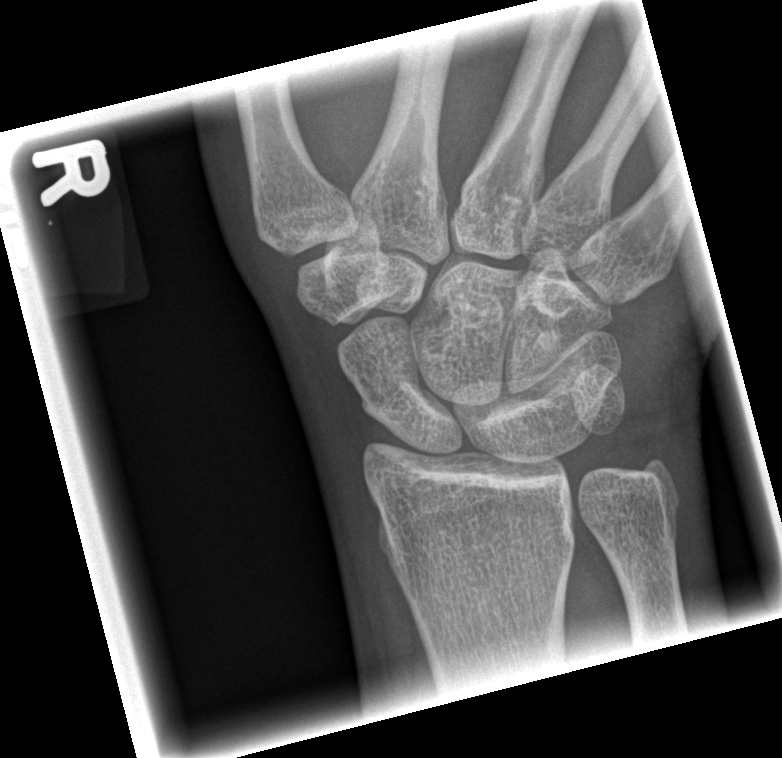

[4 of 4 positions shown; findings below may reference images not displayed]

FINDINGS: No evidence of acute fracture or malalignment. Congenital
lunotriquetral coalition. Joint spaces are maintained. No
significant arthropathy. Soft tissues are unremarkable.
IMPRESSION: 1. No acute osseous abnormality.
2. Congenital lunotriquetral coalition.

## 2022-09-30 DIAGNOSIS — Z419 Encounter for procedure for purposes other than remedying health state, unspecified: Secondary | ICD-10-CM | POA: Diagnosis not present

## 2022-10-21 DIAGNOSIS — Z113 Encounter for screening for infections with a predominantly sexual mode of transmission: Secondary | ICD-10-CM | POA: Diagnosis not present

## 2022-10-29 ENCOUNTER — Other Ambulatory Visit: Payer: Self-pay

## 2022-10-29 ENCOUNTER — Encounter (HOSPITAL_BASED_OUTPATIENT_CLINIC_OR_DEPARTMENT_OTHER): Payer: Self-pay

## 2022-10-29 ENCOUNTER — Emergency Department (HOSPITAL_BASED_OUTPATIENT_CLINIC_OR_DEPARTMENT_OTHER)
Admission: EM | Admit: 2022-10-29 | Discharge: 2022-10-29 | Disposition: A | Discharge status: Home / Self Care | Payer: Medicaid Other | Attending: Emergency Medicine | Admitting: Emergency Medicine

## 2022-10-29 ENCOUNTER — Emergency Department (HOSPITAL_BASED_OUTPATIENT_CLINIC_OR_DEPARTMENT_OTHER): Payer: Medicaid Other

## 2022-10-29 DIAGNOSIS — I10 Essential (primary) hypertension: Secondary | ICD-10-CM | POA: Insufficient documentation

## 2022-10-29 DIAGNOSIS — Z79899 Other long term (current) drug therapy: Secondary | ICD-10-CM | POA: Insufficient documentation

## 2022-10-29 DIAGNOSIS — M79644 Pain in right finger(s): Secondary | ICD-10-CM | POA: Diagnosis not present

## 2022-10-29 MED ORDER — IBUPROFEN 400 MG PO TABS
600.0000 mg | ORAL_TABLET | Freq: Once | ORAL | Status: DC
  Filled 2022-10-29: qty 1

## 2022-10-29 NOTE — ED Triage Notes (Signed)
C/O pain on right side of finger x 1-2 weeks; throbbing in nature, denies any known injury.

## 2022-10-29 NOTE — ED Provider Notes (Signed)
[place] [facility] Provider Note   CSN: [id number] Arrival date & time: 10/29/22  1002     History  Chief Complaint  Patient presents with   Hand Pain    Early Ord is a 27 y.o. female with history of hypertension.  Patient presents to ED for evaluation of right index finger pain.  Patient reports that 2 weeks ago she developed right index finger pain.  Patient states pain is only located in her distal DIP joint.  The patient denies any fevers, tingling, numbness of the right finger.  The patient denies any trauma to account for this pain.  Patient reports that pain began after she went to a concert, denies any trauma that night.  Patient reports that ever since this concert, she has had this pain every day.  Patient reports pain is worsened when she tries to open a soda can.  Patient denies any alleviating factors.  Patient denies history of the same.   Hand Pain       Home Medications Prior to Admission medications   Medication Sig Start Date End Date Taking? Authorizing Provider  amLODipine (NORVASC) 5 MG tablet Take 1 tablet (5 mg total) by mouth daily. 03/20/22   Katrinka Blazing, [place], CNM      Allergies    Patient has no known allergies.    Review of Systems   Review of Systems  Musculoskeletal:  Positive for arthralgias.  All other systems reviewed and are negative.   Physical Exam Updated Vital Signs BP (!) 152/96 (BP Location: Left Arm)   Pulse (!) 57   Temp 98.6 F (37 C) (Oral)   Resp 18   Ht 5\' 6"  (1.676 m)   Wt 74.8 kg   LMP 10/29/2022 (Approximate)   SpO2 100%   BMI 26.63 kg/m  Physical Exam Vitals and nursing note reviewed.  Constitutional:      General: She is not in acute distress.    Appearance: She is well-developed.  HENT:     Head: Normocephalic and atraumatic.  Eyes:     Conjunctiva/sclera: Conjunctivae normal.  Cardiovascular:     Rate and Rhythm: Normal rate and regular rhythm.     Heart sounds:  No murmur heard. Pulmonary:     Effort: Pulmonary effort is normal. No respiratory distress.     Breath sounds: Normal breath sounds.  Abdominal:     Palpations: Abdomen is soft.     Tenderness: There is no abdominal tenderness.  Musculoskeletal:        General: No swelling.     Cervical back: Neck supple.     Comments: Full range of motion to patient DIP joint.  No pain with passive extension.  No overlying skin change.  No obvious swelling compared to left.  2+ radial pulse.  Brisk cap refill.  Neurovascularly intact.  Skin:    General: Skin is warm and dry.     Capillary Refill: Capillary refill takes less than 2 seconds.  Neurological:     Mental Status: She is alert.  Psychiatric:        Mood and Affect: Mood normal.     ED Results / Procedures / Treatments   Labs (all labs ordered are listed, but only abnormal results are displayed) Labs Reviewed - No data to display  EKG None  Radiology No results found.  Procedures Procedures   Medications Ordered in ED Medications  ibuprofen (ADVIL) tablet 600 mg (600 mg Oral Patient Refused/Not Given  10/29/22 1226)    ED Course/ Medical Decision Making/ A&P  Medical Decision Making  27 year old female presents to the ED for evaluation.  Please see HPI for further details.  On examination the patient right index finger has no overlying skin change.  There is no pain with passive extension, surrounding erythema, swelling, doubt flexor tenosynovitis.  The patient has a brisk capillary refill.  The patient has no pain in her wrist or hand.  The pain is all isolated to her right index finger DIP joint.  Patient is full range of motion of her DIP joint.  Plain film imaging of patient right index finger unremarkable.  The patient will have her fingers buddy taped, advised to take NSAIDs, advised to follow-up with sports medicine which I will refer her to.  Patient was given return precautions and she voiced understanding.  She had  all her questions answered to her satisfaction.  The patient stable to discharge home at this time.   Final Clinical Impression(s) / ED Diagnoses Final diagnoses:  Finger pain, right    Rx / DC Orders ED Discharge Orders     None         Clent Ridges 10/29/22 1332    Alvira Monday, MD 10/30/22 2320

## 2022-10-29 NOTE — ED Notes (Signed)
Discharge paperwork reviewed entirely with patient, including follow up care. Pain was under control. No prescriptions were called in, but all questions were addressed.  Pt verbalized understanding as well as all parties involved. No questions or concerns voiced at the time of discharge. No acute distress noted.   Pt ambulated out to PVA without incident or assistance.  

## 2022-10-29 NOTE — Discharge Instructions (Addendum)
Return to the ED with any new or worsening signs or symptoms such as redness to right index finger, fevers Please follow-up with sports medicine which I will refer you to.  Call and make an appointment to be seen. You may take ibuprofen or Tylenol at home for pain control.  You may also ice your right index finger. Please keep finger buddy taped for comfort

## 2022-10-30 DIAGNOSIS — Z419 Encounter for procedure for purposes other than remedying health state, unspecified: Secondary | ICD-10-CM | POA: Diagnosis not present

## 2022-11-05 ENCOUNTER — Ambulatory Visit: Payer: Medicaid Other | Admitting: Family Medicine

## 2022-11-08 ENCOUNTER — Ambulatory Visit: Payer: Medicaid Other | Admitting: Family Medicine

## 2022-11-11 ENCOUNTER — Ambulatory Visit: Payer: Medicaid Other | Admitting: Family Medicine

## 2022-11-30 DIAGNOSIS — Z419 Encounter for procedure for purposes other than remedying health state, unspecified: Secondary | ICD-10-CM | POA: Diagnosis not present

## 2022-12-30 DIAGNOSIS — Z419 Encounter for procedure for purposes other than remedying health state, unspecified: Secondary | ICD-10-CM | POA: Diagnosis not present

## 2023-01-30 DIAGNOSIS — Z419 Encounter for procedure for purposes other than remedying health state, unspecified: Secondary | ICD-10-CM | POA: Diagnosis not present

## 2023-02-20 DIAGNOSIS — A5901 Trichomonal vulvovaginitis: Secondary | ICD-10-CM | POA: Diagnosis not present

## 2023-02-20 DIAGNOSIS — Z114 Encounter for screening for human immunodeficiency virus [HIV]: Secondary | ICD-10-CM | POA: Diagnosis not present

## 2023-02-20 DIAGNOSIS — Z113 Encounter for screening for infections with a predominantly sexual mode of transmission: Secondary | ICD-10-CM | POA: Diagnosis not present

## 2023-02-20 DIAGNOSIS — I10 Essential (primary) hypertension: Secondary | ICD-10-CM | POA: Diagnosis not present

## 2023-03-02 ENCOUNTER — Emergency Department (HOSPITAL_BASED_OUTPATIENT_CLINIC_OR_DEPARTMENT_OTHER)
Admission: EM | Admit: 2023-03-02 | Discharge: 2023-03-02 | Disposition: A | Discharge status: Home / Self Care | Payer: Medicaid Other | Attending: Emergency Medicine | Admitting: Emergency Medicine

## 2023-03-02 ENCOUNTER — Other Ambulatory Visit: Payer: Self-pay

## 2023-03-02 DIAGNOSIS — I10 Essential (primary) hypertension: Secondary | ICD-10-CM | POA: Insufficient documentation

## 2023-03-02 DIAGNOSIS — Z79899 Other long term (current) drug therapy: Secondary | ICD-10-CM | POA: Diagnosis not present

## 2023-03-02 DIAGNOSIS — N3001 Acute cystitis with hematuria: Secondary | ICD-10-CM | POA: Insufficient documentation

## 2023-03-02 DIAGNOSIS — Z419 Encounter for procedure for purposes other than remedying health state, unspecified: Secondary | ICD-10-CM | POA: Diagnosis not present

## 2023-03-02 DIAGNOSIS — R350 Frequency of micturition: Secondary | ICD-10-CM | POA: Diagnosis present

## 2023-03-02 LAB — URINALYSIS, ROUTINE W REFLEX MICROSCOPIC
Bilirubin Urine: NEGATIVE
Glucose, UA: NEGATIVE mg/dL
Ketones, ur: NEGATIVE mg/dL
Nitrite: NEGATIVE
Protein, ur: 100 mg/dL — AB
Specific Gravity, Urine: >=1.03 (ref 1.005–1.030)
pH: 6 (ref 5.0–8.0)

## 2023-03-02 LAB — URINALYSIS, MICROSCOPIC (REFLEX): RBC / HPF: >50 RBC/hpf (ref 0–5)

## 2023-03-02 LAB — PREGNANCY, URINE: Preg Test, Ur: NEGATIVE

## 2023-03-02 MED ORDER — NITROFURANTOIN MONOHYD MACRO 100 MG PO CAPS: 100.0000 mg | ORAL_CAPSULE | Freq: Two times a day (BID) | ORAL | 0 refills | Status: AC

## 2023-03-02 NOTE — ED Provider Notes (Signed)
[place] [facility] Provider Note  CSN: [id number] Arrival date & time: 03/02/23 6063  Chief Complaint(s) Urinary Frequency  HPI Gloria Reilly is a 27 y.o. female without significant past medical history presenting to the emergency department with urinary frequency with dysuria.  She reports this feels similar to prior urinary infections.  No back pain or abdominal pain.  No nausea or vomiting.  No fevers or chills.  No vaginal discharge or bleeding.  Symptoms have been present for 5 days.   Past Medical History Past Medical History:  Diagnosis Date   Chlamydia 03/20/2022   Gonorrhea 03/20/2022   Hypertension 02/28/2013   Normal labor 02/14/2013   Postpartum care following vaginal delivery (8/17) 02/14/2013   Patient Active Problem List   Diagnosis Date Noted   Gonorrhea 03/20/2022   Chlamydia 03/20/2022   Anemia 03/06/2013   Hypertension 02/28/2013   Home Medication(s) Prior to Admission medications   Medication Sig Start Date End Date Taking? Authorizing Provider  nitrofurantoin, macrocrystal-monohydrate, (MACROBID) 100 MG capsule Take 1 capsule (100 mg total) by mouth 2 (two) times daily. 03/02/23  Yes Lonell Grandchild, MD  amLODipine (NORVASC) 5 MG tablet Take 1 tablet (5 mg total) by mouth daily. 03/20/22   Dorathy Kinsman, CNM                                                                                                                                    Past Surgical History No past surgical history on file. Family History Family History  Problem Relation Age of Onset   Stroke Maternal Grandmother    Diabetes Maternal Grandmother    Diabetes Maternal Grandfather     Social History Social History   Tobacco Use   Smoking status: Never   Smokeless tobacco: Never  Substance Use Topics   Alcohol use: Yes    Comment: occasional    Drug use: Yes    Types: Marijuana    Comment: daily   Allergies Patient has no known  allergies.  Review of Systems Review of Systems  All other systems reviewed and are negative.   Physical Exam Vital Signs  I have reviewed the triage vital signs BP (!) 132/94   Pulse 76   Temp 98.3 F (36.8 C) (Oral)   Resp 13   LMP 02/10/2023 (Exact Date)   SpO2 98%  Physical Exam Vitals and nursing note reviewed.  Constitutional:      Appearance: Normal appearance.  HENT:     Head: Normocephalic and atraumatic.     Mouth/Throat:     Mouth: Mucous membranes are moist.  Eyes:     Conjunctiva/sclera: Conjunctivae normal.  Cardiovascular:     Rate and Rhythm: Normal rate.  Pulmonary:     Effort: Pulmonary effort is normal. No respiratory distress.  Abdominal:     General: Abdomen is flat.     Tenderness: There is no abdominal  tenderness. There is no right CVA tenderness or left CVA tenderness.  Musculoskeletal:        General: No deformity.  Skin:    General: Skin is warm and dry.     Capillary Refill: Capillary refill takes less than 2 seconds.  Neurological:     General: No focal deficit present.     Mental Status: She is alert. Mental status is at baseline.  Psychiatric:        Mood and Affect: Mood normal.        Behavior: Behavior normal.     ED Results and Treatments Labs (all labs ordered are listed, but only abnormal results are displayed) Labs Reviewed  URINALYSIS, ROUTINE W REFLEX MICROSCOPIC - Abnormal; Notable for the following components:      Result Value   APPearance CLOUDY (*)    Hgb urine dipstick LARGE (*)    Protein, ur 100 (*)    Leukocytes,Ua SMALL (*)    All other components within normal limits  URINALYSIS, MICROSCOPIC (REFLEX) - Abnormal; Notable for the following components:   Bacteria, UA MANY (*)    All other components within normal limits  PREGNANCY, URINE                                                                                                                          Radiology No results found.  Pertinent labs &  imaging results that were available during my care of the patient were reviewed by me and considered in my medical decision making (see MDM for details).  Medications Ordered in ED Medications - No data to display                                                                                                                                   Procedures Procedures  (including critical care time)  Medical Decision Making / ED Course   MDM:  27 year old female presenting to the emergency department with urinary symptoms.  Patient well-appearing, vital signs reassuring, no fever, tachycardia.  No CVA tenderness or abdominal tenderness on exam.  Symptoms seem similar to patient's prior history of urine infection.  Will check urinalysis and urine pregnancy.  Anticipate discharge.  Patient denies any vaginal complaints to suggest STI.  Clinical Course as of 03/02/23 8119  Wynelle Link Mar 02, 2023  1478 Urinalysis does show signs of UTI. Will prescribe macrobid. Will discharge  patient to home. All questions answered. Patient comfortable with plan of discharge. Return precautions discussed with patient and specified on the after visit summary.  [WS]    Clinical Course User Index [WS] Lonell Grandchild, MD      Lab Tests: -I ordered, reviewed, and interpreted labs.   The pertinent results include:   Labs Reviewed  URINALYSIS, ROUTINE W REFLEX MICROSCOPIC - Abnormal; Notable for the following components:      Result Value   APPearance CLOUDY (*)    Hgb urine dipstick LARGE (*)    Protein, ur 100 (*)    Leukocytes,Ua SMALL (*)    All other components within normal limits  URINALYSIS, MICROSCOPIC (REFLEX) - Abnormal; Notable for the following components:   Bacteria, UA MANY (*)    All other components within normal limits  PREGNANCY, URINE    Notable for signs of UTI   Medicines ordered and prescription drug management: Meds ordered this encounter  Medications   nitrofurantoin,  macrocrystal-monohydrate, (MACROBID) 100 MG capsule    Sig: Take 1 capsule (100 mg total) by mouth 2 (two) times daily.    Dispense:  10 capsule    Refill:  0    -I have reviewed the patients home medicines and have made adjustments as needed    Co morbidities that complicate the patient evaluation  Past Medical History:  Diagnosis Date   Chlamydia 03/20/2022   Gonorrhea 03/20/2022   Hypertension 02/28/2013   Normal labor 02/14/2013   Postpartum care following vaginal delivery (8/17) 02/14/2013      Dispostion: Disposition decision including need for hospitalization was considered, and patient discharged from emergency department.    Final Clinical Impression(s) / ED Diagnoses Final diagnoses:  Acute cystitis with hematuria     This chart was dictated using voice recognition software.  Despite best efforts to proofread,  errors can occur which can change the documentation meaning.    Lonell Grandchild, MD 03/02/23 [phone]

## 2023-03-02 NOTE — ED Triage Notes (Signed)
Pt states that she has had increased frequency and pain with urination x 5 days. She reports that pain is similar as previous UTI's. Denies discharge

## 2023-03-18 DIAGNOSIS — B3731 Acute candidiasis of vulva and vagina: Secondary | ICD-10-CM | POA: Diagnosis not present

## 2023-03-18 DIAGNOSIS — Z113 Encounter for screening for infections with a predominantly sexual mode of transmission: Secondary | ICD-10-CM | POA: Diagnosis not present

## 2023-04-01 DIAGNOSIS — Z419 Encounter for procedure for purposes other than remedying health state, unspecified: Secondary | ICD-10-CM | POA: Diagnosis not present

## 2023-04-03 DIAGNOSIS — M79669 Pain in unspecified lower leg: Secondary | ICD-10-CM | POA: Diagnosis not present

## 2023-04-03 DIAGNOSIS — M79606 Pain in leg, unspecified: Secondary | ICD-10-CM | POA: Diagnosis not present

## 2023-04-04 ENCOUNTER — Ambulatory Visit (HOSPITAL_COMMUNITY)
Admission: RE | Admit: 2023-04-04 | Discharge: 2023-04-04 | Disposition: A | Discharge status: Home / Self Care | Payer: Medicaid Other | Source: Ambulatory Visit | Attending: Cardiology | Admitting: Cardiology

## 2023-04-04 ENCOUNTER — Other Ambulatory Visit (HOSPITAL_COMMUNITY): Payer: Self-pay | Admitting: Medical

## 2023-04-04 DIAGNOSIS — M79605 Pain in left leg: Secondary | ICD-10-CM | POA: Insufficient documentation

## 2023-04-04 DIAGNOSIS — M79604 Pain in right leg: Secondary | ICD-10-CM

## 2023-04-08 DIAGNOSIS — I878 Other specified disorders of veins: Secondary | ICD-10-CM | POA: Diagnosis not present

## 2023-04-08 DIAGNOSIS — M545 Low back pain, unspecified: Secondary | ICD-10-CM | POA: Diagnosis not present

## 2023-05-01 ENCOUNTER — Encounter (HOSPITAL_COMMUNITY): Payer: Self-pay

## 2023-05-01 ENCOUNTER — Emergency Department (HOSPITAL_COMMUNITY)
Admission: EM | Admit: 2023-05-01 | Discharge: 2023-05-01 | Discharge status: Against Medical Advice | Payer: Medicaid Other | Attending: Emergency Medicine | Admitting: Emergency Medicine

## 2023-05-01 ENCOUNTER — Other Ambulatory Visit: Payer: Self-pay

## 2023-05-01 DIAGNOSIS — R2231 Localized swelling, mass and lump, right upper limb: Secondary | ICD-10-CM | POA: Diagnosis not present

## 2023-05-01 DIAGNOSIS — Z5321 Procedure and treatment not carried out due to patient leaving prior to being seen by health care provider: Secondary | ICD-10-CM | POA: Insufficient documentation

## 2023-05-01 NOTE — ED Triage Notes (Signed)
Pt states she pulled a hang nail and now her right 4th finger tip has 1+ swelling and is erythematous.

## 2023-05-01 NOTE — ED Notes (Signed)
Called pt X3 no answer 

## 2023-05-02 ENCOUNTER — Ambulatory Visit (HOSPITAL_COMMUNITY)
Admission: EM | Admit: 2023-05-02 | Discharge: 2023-05-02 | Disposition: A | Discharge status: Home / Self Care | Payer: Medicaid Other

## 2023-05-02 ENCOUNTER — Encounter (HOSPITAL_COMMUNITY): Payer: Self-pay | Admitting: Emergency Medicine

## 2023-05-02 DIAGNOSIS — L03011 Cellulitis of right finger: Secondary | ICD-10-CM

## 2023-05-02 DIAGNOSIS — Z419 Encounter for procedure for purposes other than remedying health state, unspecified: Secondary | ICD-10-CM | POA: Diagnosis not present

## 2023-05-02 MED ORDER — IBUPROFEN 800 MG PO TABS
ORAL_TABLET | ORAL | Status: AC
Start: 2023-05-02 — End: ?
  Filled 2023-05-02: qty 1

## 2023-05-02 MED ORDER — IBUPROFEN 800 MG PO TABS
800.0000 mg | ORAL_TABLET | Freq: Once | ORAL | Status: AC
  Administered 2023-05-02: 800 mg via ORAL

## 2023-05-02 MED ORDER — IBUPROFEN 800 MG PO TABS: 800.0000 mg | ORAL_TABLET | Freq: Three times a day (TID) | ORAL | 0 refills | Status: AC

## 2023-05-02 MED ORDER — CEPHALEXIN 500 MG PO CAPS
500.0000 mg | ORAL_CAPSULE | Freq: Two times a day (BID) | ORAL | 0 refills | Status: AC
Start: 2023-05-02 — End: 2023-05-09

## 2023-05-02 NOTE — ED Provider Notes (Signed)
[facility]    CSN: [id number] Arrival date & time: 05/02/23  1324      History   Chief Complaint Chief Complaint  Patient presents with   Hand Pain    HPI Gloria Reilly is a 27 y.o. female.   Patient reports pain and swelling with a gathering of pus around her right fourth fingernail that has been present for the past few days.  She thinks around 4 days ago she picked off a hangnail.  Since then her fourth finger has been hot, swelling and she did notice some pus.  She did attempt to squeeze it to drain it, did not get anything out.  The history is provided by the patient and medical records.  Hand Pain    Past Medical History:  Diagnosis Date   Chlamydia 03/20/2022   Gonorrhea 03/20/2022   Hypertension 02/28/2013   Normal labor 02/14/2013   Postpartum care following vaginal delivery (8/17) 02/14/2013    Patient Active Problem List   Diagnosis Date Noted   Gonorrhea 03/20/2022   Chlamydia 03/20/2022   Anemia 03/06/2013   Hypertension 02/28/2013    History reviewed. No pertinent surgical history.  OB History     Gravida  1   Para  1   Term  1   Preterm      AB      Living  1      SAB      IAB      Ectopic      Multiple      Live Births  1            Home Medications    Prior to Admission medications   Medication Sig Start Date End Date Taking? Authorizing Provider  cephALEXin (KEFLEX) 500 MG capsule Take 1 capsule (500 mg total) by mouth 2 (two) times daily for 7 days. 05/02/23 05/09/23 Yes Rinaldo Ratel, [place], FNP  gabapentin (NEURONTIN) 100 MG capsule Take 100 mg by mouth at bedtime. 04/03/23  Yes [provider]  ibuprofen (ADVIL) 800 MG tablet Take 1 tablet (800 mg total) by mouth 3 (three) times daily. 05/02/23  Yes Rinaldo Ratel, [place], FNP  amLODipine (NORVASC) 5 MG tablet Take 1 tablet (5 mg total) by mouth daily. 03/20/22   Katrinka Blazing, [place], CNM  nitrofurantoin, macrocrystal-monohydrate, (MACROBID) 100 MG capsule  Take 1 capsule (100 mg total) by mouth 2 (two) times daily. 03/02/23   Lonell Grandchild, MD    Family History Family History  Problem Relation Age of Onset   Stroke Maternal Grandmother    Diabetes Maternal Grandmother    Diabetes Maternal Grandfather     Social History Social History   Tobacco Use   Smoking status: Never   Smokeless tobacco: Never  Substance Use Topics   Alcohol use: Yes    Comment: occasional    Drug use: Yes    Types: Marijuana    Comment: daily     Allergies   Patient has no known allergies.   Review of Systems Review of Systems  Per HPI   Physical Exam Triage Vital Signs ED Triage Vitals  Encounter Vitals Group     BP 05/02/23 1000 (!) 153/109     Systolic BP Percentile --      Diastolic BP Percentile --      Pulse Rate 05/02/23 1000 68     Resp 05/02/23 1000 15     Temp 05/02/23 1000 98.3 F (36.8 C)  Temp Source 05/02/23 1000 Oral     SpO2 05/02/23 1000 98 %     Weight --      Height --      Head Circumference --      Peak Flow --      Pain Score 05/02/23 0959 7     Pain Loc --      Pain Education --      Exclude from Growth Chart --    No data found.  Updated Vital Signs BP (!) 153/109 (BP Location: Left Arm)   Pulse 68   Temp 98.3 F (36.8 C) (Oral)   Resp 15   LMP 04/27/2023   SpO2 98%   Visual Acuity Right Eye Distance:   Left Eye Distance:   Bilateral Distance:    Right Eye Near:   Left Eye Near:    Bilateral Near:     Physical Exam Vitals and nursing note reviewed.  Constitutional:      Appearance: Normal appearance.  HENT:     Head: Normocephalic and atraumatic.     Right Ear: External ear normal.     Left Ear: External ear normal.     Nose: Nose normal.     Mouth/Throat:     Mouth: Mucous membranes are moist.  Eyes:     Conjunctiva/sclera: Conjunctivae normal.  Cardiovascular:     Rate and Rhythm: Normal rate.     Pulses: Normal pulses.  Pulmonary:     Effort: Pulmonary effort is normal.   Musculoskeletal:        General: Swelling and tenderness present. Normal range of motion.  Skin:    General: Skin is warm and dry.     Capillary Refill: Capillary refill takes less than 2 seconds.     Findings: Erythema present.     Comments: Paronychia to right fourth finger.   Neurological:     General: No focal deficit present.     Mental Status: She is alert and oriented to person, place, and time.  Psychiatric:        Mood and Affect: Mood normal.        Behavior: Behavior normal. Behavior is cooperative.      UC Treatments / Results  Labs (all labs ordered are listed, but only abnormal results are displayed) Labs Reviewed - No data to display  EKG   Radiology No results found.  Procedures Incision and Drainage  Date/Time: 05/02/2023 10:25 AM  Performed by: Rawlin Reaume, [place], FNP Authorized by: Lucian Baswell, [place], FNP   Consent:    Consent obtained:  Verbal   Consent given by:  Patient   Risks, benefits, and alternatives were discussed: yes     Risks discussed:  Incomplete drainage and pain   Alternatives discussed:  No treatment and delayed treatment Universal protocol:    Procedure explained and questions answered to patient or proxy's satisfaction: yes     Patient identity confirmed:  Verbally with patient Location:    Indications for incision and drainage: paronychia.   Location:  Upper extremity   Upper extremity location:  Finger   Finger location:  R ring finger Pre-procedure details:    Skin preparation:  Chlorhexidine with alcohol Sedation:    Sedation type:  None Anesthesia:    Anesthesia method:  Topical application   Topical anesthesia: pain-ease spray. Procedure type:    Complexity:  Simple Procedure details:    Incision types:  Stab incision   Drainage:  Purulent   Drainage amount:  Scant   Wound treatment:  Wound left open   Packing materials:  None Post-procedure details:    Procedure completion:  Tolerated well, no immediate  complications  (including critical care time)  Medications Ordered in UC Medications  ibuprofen (ADVIL) tablet 800 mg (800 mg Oral Given 05/02/23 1022)    Initial Impression / Assessment and Plan / UC Course  I have reviewed the triage vital signs and the nursing notes.  Pertinent labs & imaging results that were available during my care of the patient were reviewed by me and considered in my medical decision making (see chart for details).  Vitals and triage reviewed, patient is hemodynamically stable.  Paronychia to right fourth finger with erythema and swelling extending to mid finger.  Stab incision created after area was cleaned and numbed with topical medication, purulent drainage expelled, see procedure note for further details.  Will place on Keflex to prevent worsening bacterial infection.  Wound care discussed.  Pain management reviewed.  Plan of care, follow-up care and return precautions given, no questions at this time.     Final Clinical Impressions(s) / UC Diagnoses   Final diagnoses:  Paronychia of finger of right hand     Discharge Instructions      We drained a good amount of pus out of your finger today.  Please keep it covered throughout the day.  Do warm soaks 3 times daily and warm water with antibacterial solution like Dial soap or Hibiclens to help clean.  You can apply topical antibacterial ointment as well.  Take all antibiotics as prescribed and until finished.  You can take the ibuprofen 3 times daily to help with pain and inflammation.  Return to clinic for any new or urgent symptoms.     ED Prescriptions     Medication Sig Dispense Auth. Provider   cephALEXin (KEFLEX) 500 MG capsule Take 1 capsule (500 mg total) by mouth 2 (two) times daily for 7 days. 14 capsule Rinaldo Ratel, [place], [place]   ibuprofen (ADVIL) 800 MG tablet Take 1 tablet (800 mg total) by mouth 3 (three) times daily. 21 tablet Munachimso Rigdon, [place], [place]      PDMP not reviewed this  encounter.   Francy Mcilvaine, [place], [place] 05/02/23 1027

## 2023-05-02 NOTE — ED Triage Notes (Signed)
Pt reports pain and swelling around right 4th finger nail for several days. Denies drainage.

## 2023-05-02 NOTE — Discharge Instructions (Addendum)
We drained a good amount of pus out of your finger today.  Please keep it covered throughout the day.  Do warm soaks 3 times daily and warm water with antibacterial solution like Dial soap or Hibiclens to help clean.  You can apply topical antibacterial ointment as well.  Take all antibiotics as prescribed and until finished.  You can take the ibuprofen 3 times daily to help with pain and inflammation.  Return to clinic for any new or urgent symptoms.

## 2023-06-01 DIAGNOSIS — Z419 Encounter for procedure for purposes other than remedying health state, unspecified: Secondary | ICD-10-CM | POA: Diagnosis not present

## 2023-07-02 DIAGNOSIS — Z419 Encounter for procedure for purposes other than remedying health state, unspecified: Secondary | ICD-10-CM | POA: Diagnosis not present

## 2023-07-30 DIAGNOSIS — Z113 Encounter for screening for infections with a predominantly sexual mode of transmission: Secondary | ICD-10-CM | POA: Diagnosis not present

## 2023-08-02 DIAGNOSIS — Z419 Encounter for procedure for purposes other than remedying health state, unspecified: Secondary | ICD-10-CM | POA: Diagnosis not present

## 2023-08-30 DIAGNOSIS — Z419 Encounter for procedure for purposes other than remedying health state, unspecified: Secondary | ICD-10-CM | POA: Diagnosis not present

## 2023-10-11 DIAGNOSIS — Z419 Encounter for procedure for purposes other than remedying health state, unspecified: Secondary | ICD-10-CM | POA: Diagnosis not present

## 2023-10-23 DIAGNOSIS — Z113 Encounter for screening for infections with a predominantly sexual mode of transmission: Secondary | ICD-10-CM | POA: Diagnosis not present

## 2023-11-03 DIAGNOSIS — I10 Essential (primary) hypertension: Secondary | ICD-10-CM | POA: Diagnosis not present

## 2023-11-03 DIAGNOSIS — Z131 Encounter for screening for diabetes mellitus: Secondary | ICD-10-CM | POA: Diagnosis not present

## 2023-11-03 DIAGNOSIS — Z1329 Encounter for screening for other suspected endocrine disorder: Secondary | ICD-10-CM | POA: Diagnosis not present

## 2023-11-03 DIAGNOSIS — Z91148 Patient's other noncompliance with medication regimen for other reason: Secondary | ICD-10-CM | POA: Diagnosis not present

## 2023-11-03 DIAGNOSIS — Z136 Encounter for screening for cardiovascular disorders: Secondary | ICD-10-CM | POA: Diagnosis not present

## 2023-11-03 DIAGNOSIS — I499 Cardiac arrhythmia, unspecified: Secondary | ICD-10-CM | POA: Diagnosis not present

## 2023-11-10 DIAGNOSIS — Z419 Encounter for procedure for purposes other than remedying health state, unspecified: Secondary | ICD-10-CM | POA: Diagnosis not present

## 2023-12-09 DIAGNOSIS — B3731 Acute candidiasis of vulva and vagina: Secondary | ICD-10-CM | POA: Diagnosis not present

## 2023-12-09 DIAGNOSIS — I10 Essential (primary) hypertension: Secondary | ICD-10-CM | POA: Diagnosis not present

## 2023-12-09 DIAGNOSIS — N760 Acute vaginitis: Secondary | ICD-10-CM | POA: Diagnosis not present

## 2023-12-09 DIAGNOSIS — E559 Vitamin D deficiency, unspecified: Secondary | ICD-10-CM | POA: Diagnosis not present

## 2023-12-09 DIAGNOSIS — N898 Other specified noninflammatory disorders of vagina: Secondary | ICD-10-CM | POA: Diagnosis not present

## 2023-12-09 DIAGNOSIS — R7989 Other specified abnormal findings of blood chemistry: Secondary | ICD-10-CM | POA: Diagnosis not present

## 2023-12-09 DIAGNOSIS — Z113 Encounter for screening for infections with a predominantly sexual mode of transmission: Secondary | ICD-10-CM | POA: Diagnosis not present

## 2023-12-11 DIAGNOSIS — Z419 Encounter for procedure for purposes other than remedying health state, unspecified: Secondary | ICD-10-CM | POA: Diagnosis not present

## 2024-01-10 DIAGNOSIS — Z419 Encounter for procedure for purposes other than remedying health state, unspecified: Secondary | ICD-10-CM | POA: Diagnosis not present

## 2024-01-15 DIAGNOSIS — Z114 Encounter for screening for human immunodeficiency virus [HIV]: Secondary | ICD-10-CM | POA: Diagnosis not present

## 2024-01-15 DIAGNOSIS — Z113 Encounter for screening for infections with a predominantly sexual mode of transmission: Secondary | ICD-10-CM | POA: Diagnosis not present

## 2024-02-03 DIAGNOSIS — N760 Acute vaginitis: Secondary | ICD-10-CM | POA: Diagnosis not present

## 2024-02-03 DIAGNOSIS — Z113 Encounter for screening for infections with a predominantly sexual mode of transmission: Secondary | ICD-10-CM | POA: Diagnosis not present

## 2024-02-07 ENCOUNTER — Other Ambulatory Visit: Payer: Self-pay

## 2024-02-07 ENCOUNTER — Encounter (HOSPITAL_BASED_OUTPATIENT_CLINIC_OR_DEPARTMENT_OTHER): Payer: Self-pay | Admitting: Emergency Medicine

## 2024-02-07 ENCOUNTER — Emergency Department (HOSPITAL_BASED_OUTPATIENT_CLINIC_OR_DEPARTMENT_OTHER)
Admission: EM | Admit: 2024-02-07 | Discharge: 2024-02-07 | Disposition: A | Discharge status: Home / Self Care | Attending: Emergency Medicine | Admitting: Emergency Medicine

## 2024-02-07 DIAGNOSIS — S76211A Strain of adductor muscle, fascia and tendon of right thigh, initial encounter: Secondary | ICD-10-CM | POA: Diagnosis not present

## 2024-02-07 DIAGNOSIS — S76212A Strain of adductor muscle, fascia and tendon of left thigh, initial encounter: Secondary | ICD-10-CM | POA: Diagnosis not present

## 2024-02-07 DIAGNOSIS — S76812A Strain of other specified muscles, fascia and tendons at thigh level, left thigh, initial encounter: Secondary | ICD-10-CM | POA: Diagnosis not present

## 2024-02-07 DIAGNOSIS — S76219A Strain of adductor muscle, fascia and tendon of unspecified thigh, initial encounter: Secondary | ICD-10-CM

## 2024-02-07 DIAGNOSIS — X501XXA Overexertion from prolonged static or awkward postures, initial encounter: Secondary | ICD-10-CM | POA: Insufficient documentation

## 2024-02-07 DIAGNOSIS — S76811A Strain of other specified muscles, fascia and tendons at thigh level, right thigh, initial encounter: Secondary | ICD-10-CM | POA: Insufficient documentation

## 2024-02-07 MED ORDER — IBUPROFEN 600 MG PO TABS: 600.0000 mg | ORAL_TABLET | Freq: Four times a day (QID) | ORAL | 0 refills | Status: AC | PRN

## 2024-02-07 NOTE — ED Provider Notes (Signed)
 [place] [facility] Provider Note   CSN: [id number] Arrival date & time: 02/07/24  1238     Patient presents with: Groin Pain   Gloria Reilly is a 28 y.o. female who presents to the ED today with complaints of bilateral groin pain.  She was riding a mechanical bull approximately a week ago and after 4 minutes and then a night out where she was dancing the whole night, she started to have groin pain the following morning.  Pain intensifies with prolonged standing or walking, also intensifies with abduction and external rotation of the hip.  Pain qualified as persistent aching in the groins and at the left greater trochanter.  Denies any distal paresthesia or numbness.  Denies any low back pain.    Groin Pain       Prior to Admission medications   Medication Sig Start Date End Date Taking? Authorizing Provider  ibuprofen  (ADVIL ) 600 MG tablet Take 1 tablet (600 mg total) by mouth every 6 (six) hours as needed. 02/07/24  Yes Myriam Dorn BROCKS, PA  amLODipine  (NORVASC ) 5 MG tablet Take 1 tablet (5 mg total) by mouth daily. 03/20/22   Smith, [place] , CNM  gabapentin (NEURONTIN) 100 MG capsule Take 100 mg by mouth at bedtime. 04/03/23   [provider]  ibuprofen  (ADVIL ) 800 MG tablet Take 1 tablet (800 mg total) by mouth 3 (three) times daily. 05/02/23   Dreama, [place]  N, FNP  nitrofurantoin , macrocrystal-monohydrate, (MACROBID ) 100 MG capsule Take 1 capsule (100 mg total) by mouth 2 (two) times daily. 03/02/23   Francesca Elsie CROME, MD    Allergies: Patient has no known allergies.    Review of Systems  Musculoskeletal:  Positive for arthralgias.  All other systems reviewed and are negative.   Updated Vital Signs BP (!) 148/115 (BP Location: Left Arm)   Pulse 69   Temp 99.3 F (37.4 C) (Oral)   Resp 18   Ht 5' 6 (1.676 m)   Wt 79.4 kg   SpO2 100%   BMI 28.25 kg/m   Physical Exam Vitals and nursing note reviewed.   Constitutional:      General: She is not in acute distress.    Appearance: She is well-developed.  HENT:     Head: Normocephalic and atraumatic.  Eyes:     Conjunctiva/sclera: Conjunctivae normal.  Cardiovascular:     Rate and Rhythm: Normal rate and regular rhythm.     Heart sounds: No murmur heard. Pulmonary:     Effort: Pulmonary effort is normal. No respiratory distress.     Breath sounds: Normal breath sounds.  Abdominal:     Palpations: Abdomen is soft.     Tenderness: There is no abdominal tenderness.  Musculoskeletal:        General: No swelling.     Cervical back: Neck supple.     Right hip: Tenderness present.     Left hip: Tenderness present.     Comments: Bilateral tenderness at the bilateral hips.  Limited external rotation secondary to pain.  No crepitus or deformities are noted.  Skin:    General: Skin is warm and dry.     Capillary Refill: Capillary refill takes less than 2 seconds.  Neurological:     Mental Status: She is alert.  Psychiatric:        Mood and Affect: Mood normal.     (all labs ordered are listed, but only abnormal results are displayed) Labs Reviewed - No data  to display  EKG: None  Radiology: No results found.   Procedures   Medications Ordered in the ED - No data to display                                  Medical Decision Making Risk Prescription drug management.   Clinical evaluation of this patient, symptoms seem consistent with strain of the ligaments/tendons in the groin.  As they have worsened with abduction and external rotation of the hip, and secondary to prolonged tension of the hip flexor muscles, and there is no other fall mechanism or other traumatic mechanism to raise concern for bony injury to the same, imaging is deferred at this time.  Will manage with outpatient course of ibuprofen , follow-up with primary care as needed for pain.     Final diagnoses:  Groin strain, unspecified laterality, initial  encounter    ED Discharge Orders          Ordered    ibuprofen  (ADVIL ) 600 MG tablet  Every 6 hours PRN        02/07/24 1408               Myriam Dorn BROCKS, [place] 02/07/24 1412    Doretha Folks, MD 02/11/24 [phone]

## 2024-02-07 NOTE — ED Triage Notes (Signed)
 Pt reports bil groin pain for 1 wk after riding a mechanical bull x 4 minutes and then dancing at church

## 2024-02-10 DIAGNOSIS — Z419 Encounter for procedure for purposes other than remedying health state, unspecified: Secondary | ICD-10-CM | POA: Diagnosis not present

## 2024-03-05 DIAGNOSIS — A599 Trichomoniasis, unspecified: Secondary | ICD-10-CM | POA: Diagnosis not present

## 2024-03-05 DIAGNOSIS — Z113 Encounter for screening for infections with a predominantly sexual mode of transmission: Secondary | ICD-10-CM | POA: Diagnosis not present

## 2024-03-12 DIAGNOSIS — Z419 Encounter for procedure for purposes other than remedying health state, unspecified: Secondary | ICD-10-CM | POA: Diagnosis not present

## 2024-03-18 DIAGNOSIS — Z113 Encounter for screening for infections with a predominantly sexual mode of transmission: Secondary | ICD-10-CM | POA: Diagnosis not present

## 2024-03-18 DIAGNOSIS — N390 Urinary tract infection, site not specified: Secondary | ICD-10-CM | POA: Diagnosis not present

## 2024-04-16 DIAGNOSIS — Z114 Encounter for screening for human immunodeficiency virus [HIV]: Secondary | ICD-10-CM | POA: Diagnosis not present

## 2024-04-16 DIAGNOSIS — Z113 Encounter for screening for infections with a predominantly sexual mode of transmission: Secondary | ICD-10-CM | POA: Diagnosis not present

## 2024-04-16 DIAGNOSIS — B3731 Acute candidiasis of vulva and vagina: Secondary | ICD-10-CM | POA: Diagnosis not present

## 2024-05-07 ENCOUNTER — Encounter (HOSPITAL_BASED_OUTPATIENT_CLINIC_OR_DEPARTMENT_OTHER): Payer: Self-pay

## 2024-05-07 ENCOUNTER — Other Ambulatory Visit: Payer: Self-pay

## 2024-05-07 ENCOUNTER — Emergency Department (HOSPITAL_BASED_OUTPATIENT_CLINIC_OR_DEPARTMENT_OTHER)
Admission: EM | Admit: 2024-05-07 | Discharge: 2024-05-08 | Disposition: A | Discharge status: Home / Self Care | Attending: Emergency Medicine | Admitting: Emergency Medicine

## 2024-05-07 ENCOUNTER — Emergency Department (HOSPITAL_BASED_OUTPATIENT_CLINIC_OR_DEPARTMENT_OTHER)

## 2024-05-07 DIAGNOSIS — Z79899 Other long term (current) drug therapy: Secondary | ICD-10-CM | POA: Diagnosis not present

## 2024-05-07 DIAGNOSIS — R6883 Chills (without fever): Secondary | ICD-10-CM | POA: Diagnosis not present

## 2024-05-07 DIAGNOSIS — R0981 Nasal congestion: Secondary | ICD-10-CM | POA: Diagnosis not present

## 2024-05-07 DIAGNOSIS — J3489 Other specified disorders of nose and nasal sinuses: Secondary | ICD-10-CM | POA: Insufficient documentation

## 2024-05-07 DIAGNOSIS — R051 Acute cough: Secondary | ICD-10-CM | POA: Diagnosis not present

## 2024-05-07 DIAGNOSIS — M25572 Pain in left ankle and joints of left foot: Secondary | ICD-10-CM | POA: Diagnosis not present

## 2024-05-07 LAB — RESP PANEL BY RT-PCR (RSV, FLU A&B, COVID)  RVPGX2
Influenza A by PCR: NEGATIVE
Influenza B by PCR: NEGATIVE
Resp Syncytial Virus by PCR: NEGATIVE
SARS Coronavirus 2 by RT PCR: NEGATIVE

## 2024-05-07 MED ORDER — AZITHROMYCIN 250 MG PO TABS: 250.0000 mg | ORAL_TABLET | Freq: Every day | ORAL | 0 refills | Status: AC

## 2024-05-07 NOTE — ED Provider Notes (Signed)
 [place] [facility] Provider Note   CSN: [id number] Arrival date & time: 05/07/24  2201    Patient presents with: cough, left ankle pain   Gloria Reilly is a 28 y.o. female here for evaluation of cough and left ankle pain.  Patient states she has had cough over the last week.  Productive of green and yellow sputum.  No hemoptysis.  No chest pain, shortness of breath.  Some chills without fever.  He had associated congestion.  Also notes that she twisted her left ankle in dance class.  Has been able to walk.  No redness, warmth, swelling.  No numbness or weakness.   HPI     Prior to Admission medications   Medication Sig Start Date End Date Taking? Authorizing Provider  azithromycin  (ZITHROMAX ) 250 MG tablet Take 1 tablet (250 mg total) by mouth daily. Take first 2 tablets together, then 1 every day until finished. 05/07/24  Yes Falon Huesca A, PA-C  amLODipine  (NORVASC ) 5 MG tablet Take 1 tablet (5 mg total) by mouth daily. 03/20/22   Smith, Virginia , CNM  gabapentin (NEURONTIN) 100 MG capsule Take 100 mg by mouth at bedtime. 04/03/23   [provider]  ibuprofen  (ADVIL ) 600 MG tablet Take 1 tablet (600 mg total) by mouth every 6 (six) hours as needed. 02/07/24   Myriam Dorn BROCKS, PA  ibuprofen  (ADVIL ) 800 MG tablet Take 1 tablet (800 mg total) by mouth 3 (three) times daily. 05/02/23   Dreama, [place]  N, FNP  nitrofurantoin , macrocrystal-monohydrate, (MACROBID ) 100 MG capsule Take 1 capsule (100 mg total) by mouth 2 (two) times daily. 03/02/23   Francesca Elsie CROME, MD    Allergies: Patient has no known allergies.    Review of Systems  Constitutional: Negative.   HENT:  Positive for congestion.   Respiratory:  Positive for cough.   Cardiovascular: Negative.   Gastrointestinal: Negative.   Genitourinary: Negative.   Musculoskeletal:        Left ankle pain  Skin: Negative.   Neurological: Negative.   All other systems reviewed and  are negative.   Updated Vital Signs BP (!) 137/99 (BP Location: Right Arm)   Pulse 83   Temp 97.8 F (36.6 C)   Resp 18   Ht 5' 6 (1.676 m)   Wt 79.4 kg   LMP 04/25/2024 (Approximate)   SpO2 100%   BMI 28.25 kg/m   Physical Exam Vitals and nursing note reviewed.  Constitutional:      General: She is not in acute distress.    Appearance: She is well-developed. She is not ill-appearing, toxic-appearing or diaphoretic.  HENT:     Head: Normocephalic and atraumatic.     Nose: Nose normal.     Mouth/Throat:     Mouth: Mucous membranes are moist.  Eyes:     Pupils: Pupils are equal, round, and reactive to light.  Cardiovascular:     Rate and Rhythm: Normal rate.     Pulses: Normal pulses.          Radial pulses are 2+ on the right side and 2+ on the left side.       Dorsalis pedis pulses are 2+ on the right side and 2+ on the left side.     Heart sounds: Normal heart sounds.  Pulmonary:     Effort: Pulmonary effort is normal. No respiratory distress.     Breath sounds: Rhonchi present. No wheezing or rales.     Comments:  Mild coarse lung sounds left lung base.  Speaks in full sentences without difficulty Chest:     Chest wall: No tenderness.  Abdominal:     General: There is no distension.  Musculoskeletal:        General: No swelling, tenderness, deformity or signs of injury. Normal range of motion.     Cervical back: Normal range of motion.     Right lower leg: No edema.     Left lower leg: No edema.     Comments: No edema, erythema or warmth.  No bony tenderness, compartments soft, full range of motion.  Calf soft, nontender  Skin:    General: Skin is warm and dry.     Capillary Refill: Capillary refill takes less than 2 seconds.  Neurological:     General: No focal deficit present.     Mental Status: She is alert.     Cranial Nerves: No cranial nerve deficit.     Motor: No weakness.     Gait: Gait normal.  Psychiatric:        Mood and Affect: Mood normal.      (all labs ordered are listed, but only abnormal results are displayed) Labs Reviewed  RESP PANEL BY RT-PCR (RSV, FLU A&B, COVID)  RVPGX2    EKG: None  Radiology: DG Ankle Complete Left Result Date: 05/07/2024 CLINICAL DATA:  Left ankle pain EXAM: LEFT ANKLE COMPLETE - 3+ VIEW COMPARISON:  None Available. FINDINGS: There is no evidence of fracture, dislocation, or joint effusion. There is no evidence of arthropathy or other focal bone abnormality. Soft tissues are unremarkable. IMPRESSION: Negative. Electronically Signed   By: Luke Bun M.D.   On: 05/07/2024 22:36     Procedures   Medications Ordered in the ED - No data to display  28 year old here for evaluation of productive cough for greater than 1 week as well as left ankle pain after inversion injury.  Will treat for pneumonia given productive cough for greater than 1 week.  No hemoptysis, PERC negative, no sepsis criteria. No CP, SOB.  With regards to ankle pain she is neurovascularly intact here.  Ambulatory.  Low suspicion for septic joint, gout, hemarthrosis, VTE, ischemia, infectious process.  Will have her follow-up outpatient, [facility] for symptomatic management.  The patient has been appropriately medically screened and/or stabilized in the ED. I have low suspicion for any other emergent medical condition which would require further screening, evaluation or treatment in the ED or require inpatient management.  Patient is hemodynamically stable and in no acute distress.  Patient able to ambulate in department prior to ED.  Evaluation does not show acute pathology that would require ongoing or additional emergent interventions while in the emergency department or further inpatient treatment.  I have discussed the diagnosis with the patient and answered all questions.  Pain is been managed while in the emergency department and patient has no further complaints prior to discharge.  Patient is comfortable with plan discussed  in room and is stable for discharge at this time.  I have discussed strict return precautions for returning to the emergency department.  Patient was encouraged to follow-up with PCP/specialist refer to at discharge.                                   Medical Decision Making Amount and/or Complexity of Data Reviewed External Data Reviewed: labs, radiology and notes. Labs: ordered. Decision-making  details documented in ED Course. Radiology: ordered and independent interpretation performed. Decision-making details documented in ED Course.  Risk OTC drugs. Prescription drug management. Decision regarding hospitalization. Diagnosis or treatment significantly limited by social determinants of health.        Final diagnoses:  Acute cough  Acute left ankle pain    ED Discharge Orders          Ordered    azithromycin  (ZITHROMAX ) 250 MG tablet  Daily        05/07/24 2348               Jabe Jeanbaptiste A, PA-C 05/08/24 0011    Curatolo, Adam, DO 05/10/24 1106

## 2024-05-07 NOTE — ED Triage Notes (Signed)
 Pt reports L ankle pain since Monday. No injury reported.  Pt also reports that she has been coughing up mucous x2 weeks.

## 2024-05-07 NOTE — ED Provider Notes (Incomplete)
 [place] [facility] Provider Note   CSN: [id number] Arrival date & time: 05/07/24  2201    Patient presents with: cough, left ankle pain   Gloria Reilly is a 28 y.o. female here for evaluation of cough and left ankle pain.  Patient states she has had cough over the last week.  Productive of green and yellow sputum.  No hemoptysis.  No chest pain, shortness of breath.  Some chills without fever.  He had associated congestion.  Also notes that she twisted her left ankle in dance class.  Has been able to walk.  No redness, warmth, swelling.  No numbness or weakness.  {Add pertinent medical, surgical, social history, OB history to HPI:32947} HPI     Prior to Admission medications   Medication Sig Start Date End Date Taking? Authorizing Provider  azithromycin  (ZITHROMAX ) 250 MG tablet Take 1 tablet (250 mg total) by mouth daily. Take first 2 tablets together, then 1 every day until finished. 05/07/24  Yes Lulie Hurd A, PA-C  amLODipine  (NORVASC ) 5 MG tablet Take 1 tablet (5 mg total) by mouth daily. 03/20/22   Smith, [place] , CNM  gabapentin (NEURONTIN) 100 MG capsule Take 100 mg by mouth at bedtime. 04/03/23   [provider]  ibuprofen  (ADVIL ) 600 MG tablet Take 1 tablet (600 mg total) by mouth every 6 (six) hours as needed. 02/07/24   Myriam Dorn BROCKS, PA  ibuprofen  (ADVIL ) 800 MG tablet Take 1 tablet (800 mg total) by mouth 3 (three) times daily. 05/02/23   Garrison, [place]  N, FNP  nitrofurantoin , macrocrystal-monohydrate, (MACROBID ) 100 MG capsule Take 1 capsule (100 mg total) by mouth 2 (two) times daily. 03/02/23   Francesca Elsie CROME, MD    Allergies: Patient has no known allergies.    Review of Systems  Constitutional: Negative.   HENT:  Positive for congestion.   Respiratory:  Positive for cough.   Cardiovascular: Negative.   Gastrointestinal: Negative.   Genitourinary: Negative.   Musculoskeletal:        Left ankle pain   Skin: Negative.   Neurological: Negative.   All other systems reviewed and are negative.   Updated Vital Signs BP (!) 137/99 (BP Location: Right Arm)   Pulse 83   Temp 97.8 F (36.6 C)   Resp 18   Ht 5' 6 (1.676 m)   Wt 79.4 kg   LMP 04/25/2024 (Approximate)   SpO2 100%   BMI 28.25 kg/m   Physical Exam Vitals and nursing note reviewed.  Constitutional:      General: She is not in acute distress.    Appearance: She is well-developed. She is not ill-appearing, toxic-appearing or diaphoretic.  HENT:     Head: Normocephalic and atraumatic.     Nose: Nose normal.     Mouth/Throat:     Mouth: Mucous membranes are moist.  Eyes:     Pupils: Pupils are equal, round, and reactive to light.  Cardiovascular:     Rate and Rhythm: Normal rate.     Pulses: Normal pulses.     Heart sounds: Normal heart sounds.  Pulmonary:     Effort: Pulmonary effort is normal. No respiratory distress.     Breath sounds: Rhonchi present. No wheezing or rales.     Comments: Mild coarse lung sounds left lung base.  Speaks in full sentences without difficulty Chest:     Chest wall: No tenderness.  Abdominal:     General: There is no distension.  Musculoskeletal:        General: No swelling, tenderness, deformity or signs of injury. Normal range of motion.     Cervical back: Normal range of motion.     Right lower leg: No edema.     Left lower leg: No edema.     Comments: No edema, erythema or warmth.  No bony tenderness, compartments soft, full range of motion.  Calf soft, nontender  Skin:    General: Skin is warm and dry.     Capillary Refill: Capillary refill takes less than 2 seconds.  Neurological:     General: No focal deficit present.     Mental Status: She is alert.     Cranial Nerves: No cranial nerve deficit.     Motor: No weakness.     Gait: Gait normal.  Psychiatric:        Mood and Affect: Mood normal.     (all labs ordered are listed, but only abnormal results are  displayed) Labs Reviewed  RESP PANEL BY RT-PCR (RSV, FLU A&B, COVID)  RVPGX2    EKG: None  Radiology: DG Ankle Complete Left Result Date: 05/07/2024 CLINICAL DATA:  Left ankle pain EXAM: LEFT ANKLE COMPLETE - 3+ VIEW COMPARISON:  None Available. FINDINGS: There is no evidence of fracture, dislocation, or joint effusion. There is no evidence of arthropathy or other focal bone abnormality. Soft tissues are unremarkable. IMPRESSION: Negative. Electronically Signed   By: Luke Bun M.D.   On: 05/07/2024 22:36    {Document cardiac monitor, telemetry assessment procedure when appropriate:32947} Procedures   Medications Ordered in the ED - No data to display      {Click here for ABCD2, HEART and other calculators REFRESH Note before signing:1}                              Medical Decision Making Amount and/or Complexity of Data Reviewed Radiology: ordered.     {Document critical care time when appropriate  Document review of labs and clinical decision tools ie CHADS2VASC2, etc  Document your independent review of radiology images and any outside records  Document your discussion with family members, caretakers and with consultants  Document social determinants of health affecting pt's care  Document your decision making why or why not admission, treatments were needed:32947:::1}   Final diagnoses:  Acute cough  Acute left ankle pain    ED Discharge Orders          Ordered    azithromycin  (ZITHROMAX ) 250 MG tablet  Daily        05/07/24 2348

## 2024-05-07 NOTE — Discharge Instructions (Addendum)
 It was a pleasure taking care of you here today  X-ray of your ankle did not show significant abnormality  May take Tylenol  or ibuprofen  as needed for your ankle pain  Reassuring your cough is evidence an early pneumonia with antibiotics.  Take as prescribed.

## 2024-05-10 DIAGNOSIS — N898 Other specified noninflammatory disorders of vagina: Secondary | ICD-10-CM | POA: Diagnosis not present

## 2024-05-10 DIAGNOSIS — Z113 Encounter for screening for infections with a predominantly sexual mode of transmission: Secondary | ICD-10-CM | POA: Diagnosis not present

## 2024-05-10 DIAGNOSIS — Z114 Encounter for screening for human immunodeficiency virus [HIV]: Secondary | ICD-10-CM | POA: Diagnosis not present

## 2024-05-19 ENCOUNTER — Emergency Department (HOSPITAL_BASED_OUTPATIENT_CLINIC_OR_DEPARTMENT_OTHER)
Admission: EM | Admit: 2024-05-19 | Discharge: 2024-05-19 | Disposition: A | Discharge status: Home / Self Care | Attending: Emergency Medicine | Admitting: Emergency Medicine

## 2024-05-19 ENCOUNTER — Other Ambulatory Visit: Payer: Self-pay

## 2024-05-19 ENCOUNTER — Encounter (HOSPITAL_BASED_OUTPATIENT_CLINIC_OR_DEPARTMENT_OTHER): Payer: Self-pay | Admitting: Emergency Medicine

## 2024-05-19 ENCOUNTER — Emergency Department (HOSPITAL_BASED_OUTPATIENT_CLINIC_OR_DEPARTMENT_OTHER)

## 2024-05-19 DIAGNOSIS — B9789 Other viral agents as the cause of diseases classified elsewhere: Secondary | ICD-10-CM | POA: Diagnosis not present

## 2024-05-19 DIAGNOSIS — J069 Acute upper respiratory infection, unspecified: Secondary | ICD-10-CM | POA: Diagnosis not present

## 2024-05-19 DIAGNOSIS — R059 Cough, unspecified: Secondary | ICD-10-CM | POA: Diagnosis not present

## 2024-05-19 DIAGNOSIS — R0989 Other specified symptoms and signs involving the circulatory and respiratory systems: Secondary | ICD-10-CM | POA: Diagnosis not present

## 2024-05-19 LAB — RESP PANEL BY RT-PCR (RSV, FLU A&B, COVID)  RVPGX2
Influenza A by PCR: NEGATIVE
Influenza B by PCR: NEGATIVE
Resp Syncytial Virus by PCR: NEGATIVE
SARS Coronavirus 2 by RT PCR: NEGATIVE

## 2024-05-19 NOTE — ED Provider Notes (Signed)
 [place] [facility] Provider Note   CSN: [id number] Arrival date & time: 05/19/24  1659     Patient presents with: Cough   Gloria Reilly is a 28 y.o. female.   28 year old female presents today for concern of recurrent pneumonia.  She was recently treated for pneumonia at the beginning of November and states only took half of her antibiotic course.  She states yesterday her symptoms returned including congestion, dry cough, pharyngitis.  Also reports low-grade fever last night.  The history is provided by the patient. No language interpreter was used.       Prior to Admission medications   Medication Sig Start Date End Date Taking? Authorizing Provider  amLODipine  (NORVASC ) 5 MG tablet Take 1 tablet (5 mg total) by mouth daily. 03/20/22   Smith, [place] , CNM  azithromycin  (ZITHROMAX ) 250 MG tablet Take 1 tablet (250 mg total) by mouth daily. Take first 2 tablets together, then 1 every day until finished. 05/07/24   Henderly, Britni A, PA-C  gabapentin (NEURONTIN) 100 MG capsule Take 100 mg by mouth at bedtime. 04/03/23   [provider]  ibuprofen  (ADVIL ) 600 MG tablet Take 1 tablet (600 mg total) by mouth every 6 (six) hours as needed. 02/07/24   Myriam Dorn BROCKS, PA  ibuprofen  (ADVIL ) 800 MG tablet Take 1 tablet (800 mg total) by mouth 3 (three) times daily. 05/02/23   Dreama, [place]  N, FNP  nitrofurantoin , macrocrystal-monohydrate, (MACROBID ) 100 MG capsule Take 1 capsule (100 mg total) by mouth 2 (two) times daily. 03/02/23   Francesca Elsie CROME, MD    Allergies: Patient has no known allergies.    Review of Systems  Constitutional:  Positive for fever.  HENT:  Positive for congestion and sore throat. Negative for trouble swallowing and voice change.   Respiratory:  Positive for cough. Negative for shortness of breath.   Cardiovascular:  Negative for chest pain.    Updated Vital Signs BP 134/86   Pulse 97   Temp 99.9 F (37.7  C) (Oral)   Resp 15   LMP 04/25/2024 (Approximate)   SpO2 100%   Physical Exam Vitals and nursing note reviewed.  Constitutional:      General: She is not in acute distress.    Appearance: Normal appearance. She is not ill-appearing.  HENT:     Head: Normocephalic and atraumatic.     Nose: Nose normal.  Eyes:     Conjunctiva/sclera: Conjunctivae normal.  Cardiovascular:     Rate and Rhythm: Normal rate and regular rhythm.  Pulmonary:     Effort: Pulmonary effort is normal. No respiratory distress.  Musculoskeletal:        General: No deformity.  Skin:    Findings: No rash.  Neurological:     Mental Status: She is alert.     (all labs ordered are listed, but only abnormal results are displayed) Labs Reviewed  RESP PANEL BY RT-PCR (RSV, FLU A&B, COVID)  RVPGX2    EKG: None  Radiology: No results found.   Procedures   Medications Ordered in the ED - No data to display                                  Medical Decision Making Amount and/or Complexity of Data Reviewed Radiology: ordered.   28 year old female presents today for concern of recurrent pneumonia.  Was recently treated for pneumonia but did not  complete her antibiotic course. Will repeat x-ray. Will obtain viral respiratory panel.  Respiratory panel negative.  X-ray without acute cardiopulmonary process.  Supportive care discussed .  Patient stable for discharge. Hemodynamically she is stable. She voices understanding and is in agreement with plan.  Final diagnoses:  Viral URI with cough    ED Discharge Orders     None          Hildegard Loge, PA-C 05/19/24 1904    Lenor Hollering, MD 05/19/24 2303

## 2024-05-19 NOTE — ED Triage Notes (Signed)
 Pt recently prescribed anbx for presumed pna.  Pt did not complete antibiotics d/t developing a yeast infection.  Had 3 doses left.  Pt reports symptoms have returned with congested cough and nasal congestion.  No recorded fevers at home.

## 2024-05-19 NOTE — Discharge Instructions (Addendum)
 You likely have a viral upper respiratory illness.  X-ray without evidence of pneumonia.  COVID, flu, RSV test was negative.  This will likely take 5-7 days to run its course.  Take Tylenol  and ibuprofen  as you need to for fever control and bodyaches.  Drink plenty of fluids.  Perform a sinus rinse.  Return for any concerning symptoms.

## 2024-06-03 DIAGNOSIS — R052 Subacute cough: Secondary | ICD-10-CM | POA: Diagnosis not present

## 2024-06-03 DIAGNOSIS — Z131 Encounter for screening for diabetes mellitus: Secondary | ICD-10-CM | POA: Diagnosis not present

## 2024-06-03 DIAGNOSIS — I10 Essential (primary) hypertension: Secondary | ICD-10-CM | POA: Diagnosis not present

## 2024-06-03 DIAGNOSIS — E559 Vitamin D deficiency, unspecified: Secondary | ICD-10-CM | POA: Diagnosis not present

## 2024-06-03 DIAGNOSIS — Z1329 Encounter for screening for other suspected endocrine disorder: Secondary | ICD-10-CM | POA: Diagnosis not present

## 2024-06-10 DIAGNOSIS — A5901 Trichomonal vulvovaginitis: Secondary | ICD-10-CM | POA: Diagnosis not present

## 2024-06-10 DIAGNOSIS — Z114 Encounter for screening for human immunodeficiency virus [HIV]: Secondary | ICD-10-CM | POA: Diagnosis not present

## 2024-06-10 DIAGNOSIS — Z202 Contact with and (suspected) exposure to infections with a predominantly sexual mode of transmission: Secondary | ICD-10-CM | POA: Diagnosis not present

## 2024-06-10 DIAGNOSIS — Z113 Encounter for screening for infections with a predominantly sexual mode of transmission: Secondary | ICD-10-CM | POA: Diagnosis not present
